# Patient Record
Sex: Male | Born: 1955
Health system: Southern US, Community
[De-identification: ages and names within clinical notes are randomized; demographics above are authoritative.]

## PROBLEM LIST (undated history)

## (undated) HISTORY — PX: DENTAL SURGERY: SHX609

## (undated) HISTORY — PX: CHOLECYSTECTOMY: SHX55

---

## 2013-04-01 LAB — CBC AND DIFFERENTIAL
HCT: 44 % (ref 41–53)
Hemoglobin: 15.3 g/dL (ref 13.5–17.5)
Platelets: 136 10*3/uL — AB (ref 150–399)
WBC: 8.2 10^3/mL

## 2013-04-01 LAB — PSA: PSA: 1.4

## 2013-04-01 LAB — TSH: TSH: 1.17 u[IU]/mL (ref 0.41–5.90)

## 2013-04-01 LAB — LIPID PANEL
Cholesterol: 117 mg/dL (ref 0–200)
HDL: 31 mg/dL — AB (ref 35–70)
LDL Cholesterol: 70 mg/dL
Triglycerides: 80 mg/dL (ref 40–160)

## 2013-12-21 ENCOUNTER — Ambulatory Visit: Payer: Self-pay | Admitting: Family Medicine

## 2014-11-26 IMAGING — CR DG CHEST 2V
1 series · 2 of 2 positions shown · non-contrast
Comparison: None.

CLINICAL DATA: Cough for 10 days.

EXAM:
CHEST  2 VIEW

[Series 1: pa · 0.17mm/px · 2 of 2 slices shown]
[im 1/2]
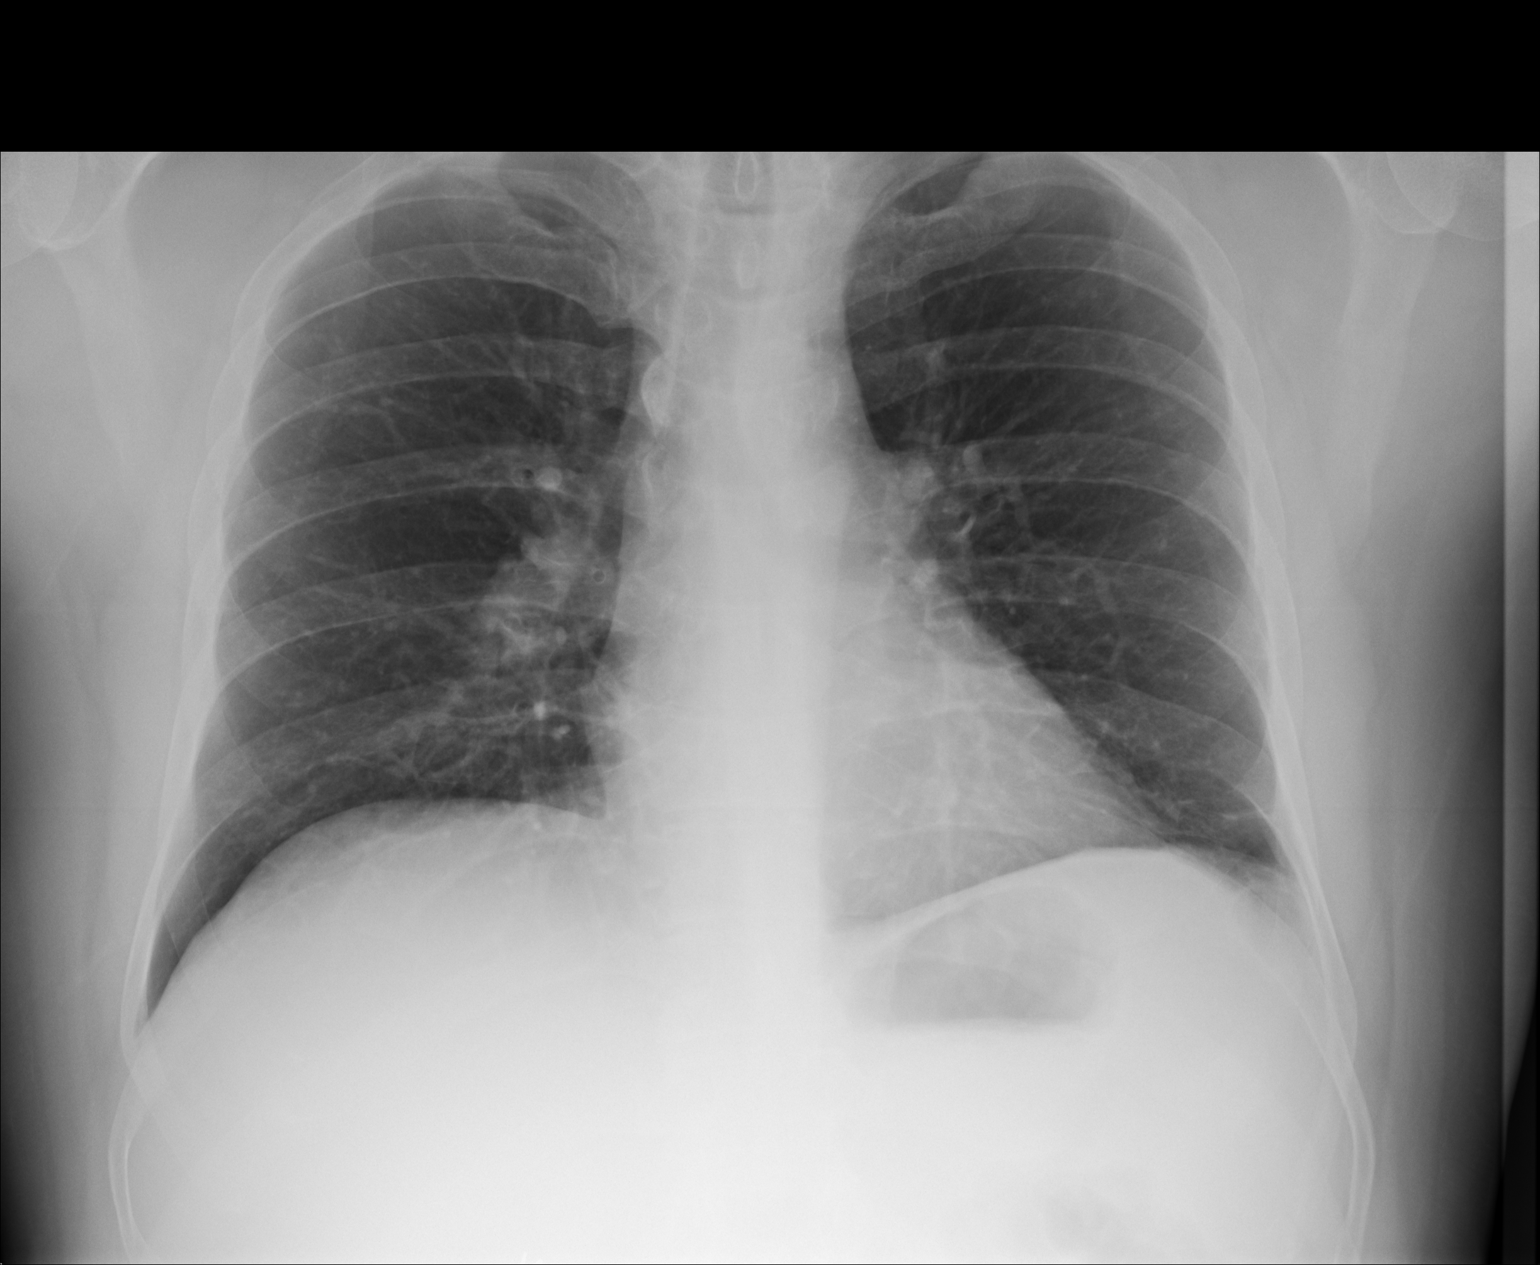
[im 2/2]
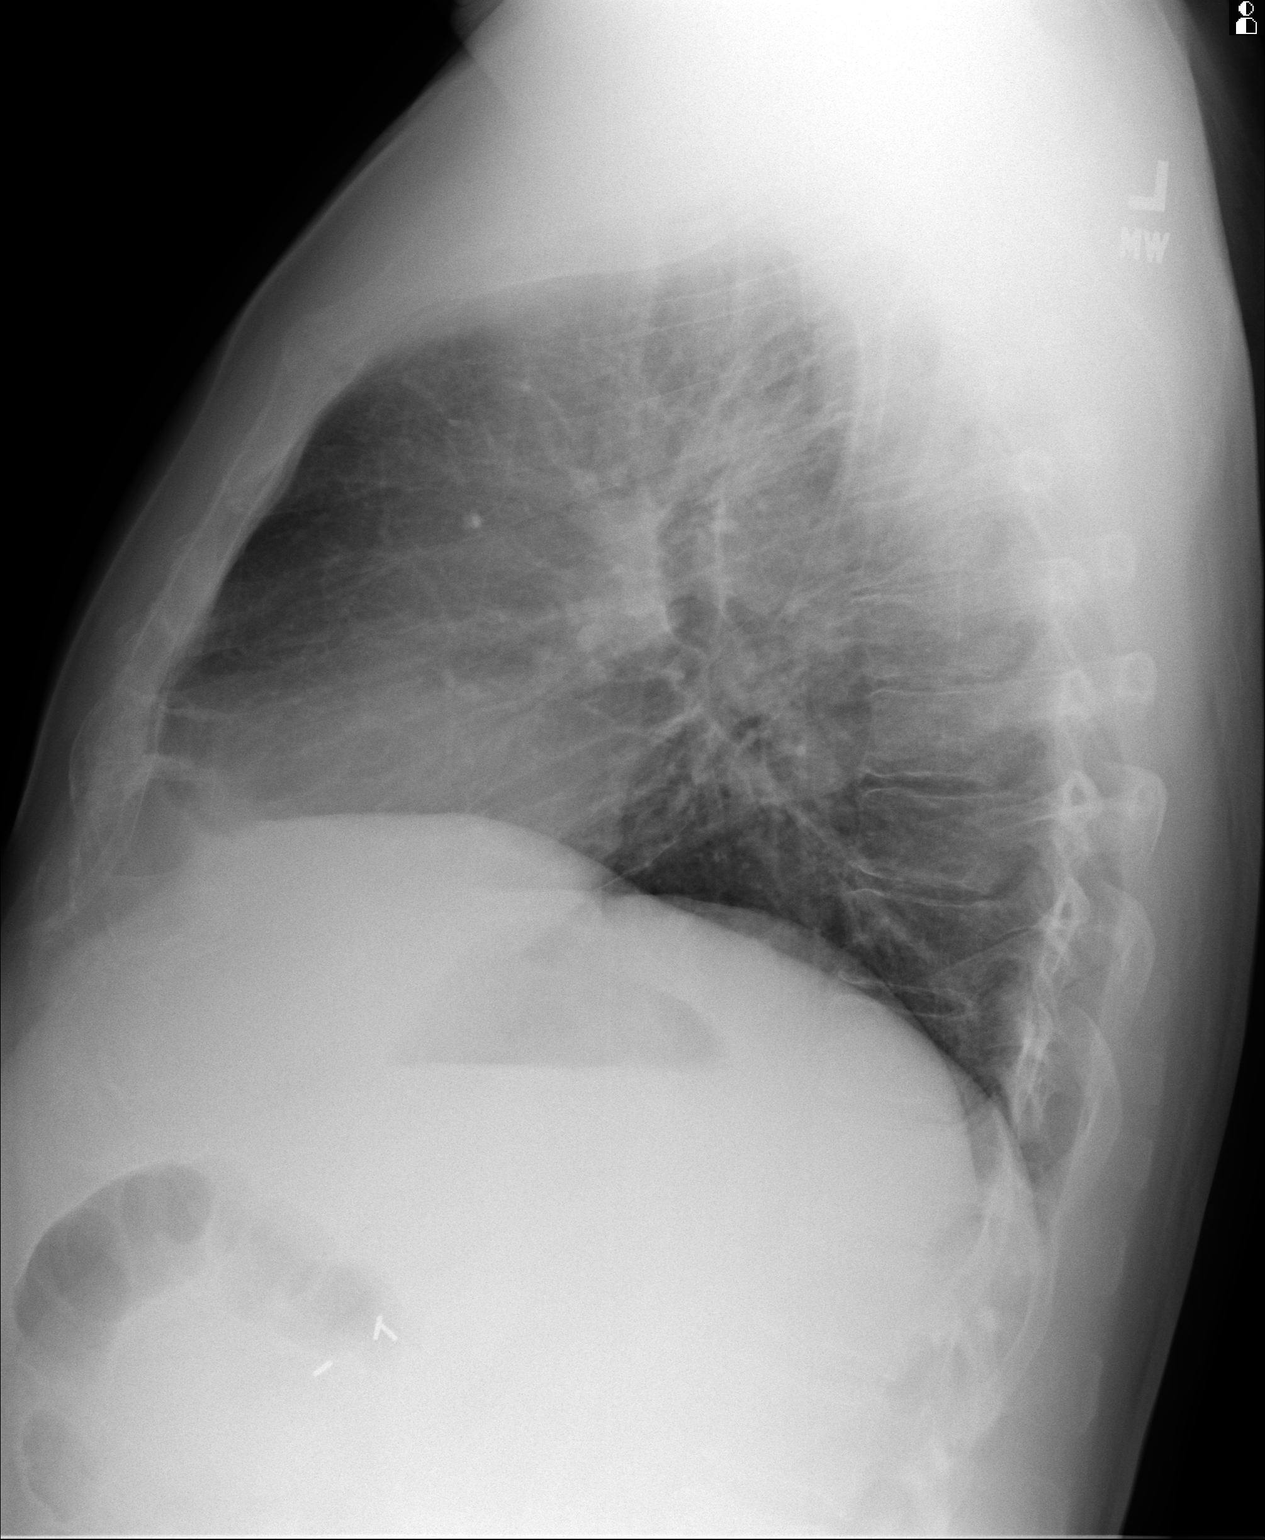

[2 of 2 positions shown; findings below may reference images not displayed]

FINDINGS: The heart size and mediastinal contours are within normal limits.
Both lungs are clear. The visualized skeletal structures are
unremarkable.
IMPRESSION: No active cardiopulmonary disease.

## 2015-03-09 ENCOUNTER — Encounter: Payer: Self-pay | Admitting: Family Medicine

## 2015-03-09 DIAGNOSIS — M5090 Cervical disc disorder, unspecified, unspecified cervical region: Secondary | ICD-10-CM

## 2015-03-09 DIAGNOSIS — E1122 Type 2 diabetes mellitus with diabetic chronic kidney disease: Secondary | ICD-10-CM

## 2015-03-09 DIAGNOSIS — N182 Chronic kidney disease, stage 2 (mild): Secondary | ICD-10-CM

## 2015-03-09 DIAGNOSIS — M5030 Other cervical disc degeneration, unspecified cervical region: Secondary | ICD-10-CM

## 2015-03-16 ENCOUNTER — Other Ambulatory Visit: Payer: Self-pay | Admitting: Family Medicine

## 2015-03-16 DIAGNOSIS — R238 Other skin changes: Secondary | ICD-10-CM

## 2015-03-16 DIAGNOSIS — I10 Essential (primary) hypertension: Secondary | ICD-10-CM

## 2015-03-16 DIAGNOSIS — E119 Type 2 diabetes mellitus without complications: Secondary | ICD-10-CM

## 2015-03-16 DIAGNOSIS — E785 Hyperlipidemia, unspecified: Secondary | ICD-10-CM

## 2015-03-16 DIAGNOSIS — Z125 Encounter for screening for malignant neoplasm of prostate: Secondary | ICD-10-CM

## 2015-03-23 ENCOUNTER — Other Ambulatory Visit: Payer: Self-pay | Admitting: Family Medicine

## 2015-03-23 DIAGNOSIS — M5030 Other cervical disc degeneration, unspecified cervical region: Secondary | ICD-10-CM

## 2015-03-23 DIAGNOSIS — J40 Bronchitis, not specified as acute or chronic: Secondary | ICD-10-CM

## 2015-03-23 MED ORDER — AZITHROMYCIN 250 MG PO TABS: ORAL_TABLET | ORAL | Status: DC

## 2015-03-23 MED ORDER — MELOXICAM 15 MG PO TABS: 15.0000 mg | ORAL_TABLET | Freq: Every day | ORAL | Status: DC

## 2015-04-13 ENCOUNTER — Other Ambulatory Visit: Payer: Self-pay | Admitting: Family Medicine

## 2015-04-13 DIAGNOSIS — M5030 Other cervical disc degeneration, unspecified cervical region: Secondary | ICD-10-CM

## 2015-04-13 MED ORDER — OXYCODONE HCL 5 MG PO TABS: 5.0000 mg | ORAL_TABLET | ORAL | Status: DC | PRN

## 2015-05-11 ENCOUNTER — Other Ambulatory Visit: Payer: Self-pay | Admitting: Family Medicine

## 2015-05-11 DIAGNOSIS — M5030 Other cervical disc degeneration, unspecified cervical region: Secondary | ICD-10-CM

## 2015-05-11 MED ORDER — OXYCODONE HCL 5 MG PO TABS: 5.0000 mg | ORAL_TABLET | ORAL | Status: DC | PRN

## 2015-05-25 ENCOUNTER — Other Ambulatory Visit: Payer: Self-pay | Admitting: Family Medicine

## 2015-05-25 DIAGNOSIS — M5030 Other cervical disc degeneration, unspecified cervical region: Secondary | ICD-10-CM

## 2015-05-25 MED ORDER — OXYCODONE HCL 5 MG PO TABS
5.0000 mg | ORAL_TABLET | ORAL | Status: DC | PRN
Start: 2015-05-25 — End: 2015-06-08

## 2015-06-08 ENCOUNTER — Other Ambulatory Visit: Payer: Self-pay | Admitting: Family Medicine

## 2015-06-08 DIAGNOSIS — M5030 Other cervical disc degeneration, unspecified cervical region: Secondary | ICD-10-CM

## 2015-06-08 MED ORDER — OXYCODONE HCL 5 MG PO TABS: 5.0000 mg | ORAL_TABLET | ORAL | Status: DC | PRN

## 2015-06-21 MED ORDER — OXYCODONE HCL 5 MG PO TABS: 5.0000 mg | ORAL_TABLET | ORAL | Status: DC | PRN

## 2015-07-05 ENCOUNTER — Other Ambulatory Visit: Payer: Self-pay | Admitting: Family Medicine

## 2015-07-05 DIAGNOSIS — F419 Anxiety disorder, unspecified: Secondary | ICD-10-CM

## 2015-07-05 MED ORDER — ALPRAZOLAM 1 MG PO TABS: 1.0000 mg | ORAL_TABLET | Freq: Two times a day (BID) | ORAL | Status: DC | PRN

## 2015-07-06 ENCOUNTER — Other Ambulatory Visit: Payer: Self-pay | Admitting: Family Medicine

## 2015-07-06 DIAGNOSIS — M5030 Other cervical disc degeneration, unspecified cervical region: Secondary | ICD-10-CM

## 2015-07-06 MED ORDER — OXYCODONE HCL 5 MG PO TABS: 5.0000 mg | ORAL_TABLET | ORAL | Status: DC | PRN

## 2015-08-22 ENCOUNTER — Encounter: Payer: Self-pay | Admitting: Family Medicine

## 2015-08-22 DIAGNOSIS — M5030 Other cervical disc degeneration, unspecified cervical region: Secondary | ICD-10-CM

## 2015-08-22 MED ORDER — OXYCODONE HCL 5 MG PO TABS: 5.0000 mg | ORAL_TABLET | ORAL | Status: DC | PRN

## 2015-09-06 ENCOUNTER — Other Ambulatory Visit: Payer: Self-pay | Admitting: Family Medicine

## 2015-09-06 DIAGNOSIS — M5030 Other cervical disc degeneration, unspecified cervical region: Secondary | ICD-10-CM

## 2015-09-06 MED ORDER — OXYCODONE HCL 5 MG PO TABS: 5.0000 mg | ORAL_TABLET | ORAL | Status: DC | PRN

## 2015-09-21 ENCOUNTER — Other Ambulatory Visit: Payer: Self-pay | Admitting: Family Medicine

## 2015-09-21 DIAGNOSIS — M5030 Other cervical disc degeneration, unspecified cervical region: Secondary | ICD-10-CM

## 2015-09-21 MED ORDER — OXYCODONE HCL 5 MG PO TABS: 5.0000 mg | ORAL_TABLET | ORAL | Status: DC | PRN

## 2015-09-28 DIAGNOSIS — M7670 Peroneal tendinitis, unspecified leg: Secondary | ICD-10-CM | POA: Insufficient documentation

## 2015-10-03 DIAGNOSIS — M109 Gout, unspecified: Secondary | ICD-10-CM | POA: Insufficient documentation

## 2015-10-03 DIAGNOSIS — E7849 Other hyperlipidemia: Secondary | ICD-10-CM | POA: Insufficient documentation

## 2015-10-03 DIAGNOSIS — K219 Gastro-esophageal reflux disease without esophagitis: Secondary | ICD-10-CM | POA: Insufficient documentation

## 2015-10-03 DIAGNOSIS — E291 Testicular hypofunction: Secondary | ICD-10-CM | POA: Insufficient documentation

## 2015-10-03 DIAGNOSIS — M1990 Unspecified osteoarthritis, unspecified site: Secondary | ICD-10-CM | POA: Insufficient documentation

## 2015-10-03 DIAGNOSIS — G4700 Insomnia, unspecified: Secondary | ICD-10-CM | POA: Insufficient documentation

## 2015-10-03 DIAGNOSIS — F419 Anxiety disorder, unspecified: Secondary | ICD-10-CM | POA: Insufficient documentation

## 2015-10-05 ENCOUNTER — Other Ambulatory Visit: Payer: Self-pay | Admitting: Family Medicine

## 2015-10-05 DIAGNOSIS — M5030 Other cervical disc degeneration, unspecified cervical region: Secondary | ICD-10-CM

## 2015-10-05 MED ORDER — LOSARTAN POTASSIUM 50 MG PO TABS
50.0000 mg | ORAL_TABLET | Freq: Every day | ORAL | Status: DC
Start: 2015-10-05 — End: 2016-09-12

## 2015-10-05 MED ORDER — OXYCODONE HCL 5 MG PO TABS: 5.0000 mg | ORAL_TABLET | ORAL | Status: DC | PRN

## 2015-10-10 LAB — CBC AND DIFFERENTIAL
HCT: 42 % (ref 41–53)
Hemoglobin: 15.2 g/dL (ref 13.5–17.5)
Neutrophils Absolute: 4 /uL
Platelets: 150 10*3/uL (ref 150–399)
WBC: 6.3 10^3/mL

## 2015-10-10 LAB — HEPATIC FUNCTION PANEL
ALT: 57 U/L — AB (ref 10–40)
AST: 25 U/L (ref 14–40)
Alkaline Phosphatase: 86 U/L (ref 25–125)
Bilirubin, Total: 1.5 mg/dL

## 2015-10-10 LAB — BASIC METABOLIC PANEL
BUN: 11 mg/dL (ref 4–21)
Creatinine: 1.1 mg/dL (ref 0.6–1.3)
Glucose: 250 mg/dL
Potassium: 4 mmol/L (ref 3.4–5.3)
Sodium: 139 mmol/L (ref 137–147)

## 2015-10-10 LAB — LIPID PANEL
Cholesterol: 140 mg/dL (ref 0–200)
HDL: 34 mg/dL — AB (ref 35–70)
LDL Cholesterol: 78 mg/dL
LDl/HDL Ratio: 2.3
Triglycerides: 141 mg/dL (ref 40–160)

## 2015-10-10 LAB — PSA: PSA: 2

## 2015-10-10 LAB — TSH: TSH: 0.74 u[IU]/mL (ref 0.41–5.90)

## 2015-10-18 DIAGNOSIS — E119 Type 2 diabetes mellitus without complications: Secondary | ICD-10-CM | POA: Insufficient documentation

## 2015-10-26 ENCOUNTER — Ambulatory Visit (INDEPENDENT_AMBULATORY_CARE_PROVIDER_SITE_OTHER): Payer: BLUE CROSS/BLUE SHIELD | Admitting: Family Medicine

## 2015-10-26 ENCOUNTER — Encounter: Payer: Self-pay | Admitting: Family Medicine

## 2015-10-26 VITALS — BP 136/62 | HR 88 | Temp 98.0°F | Resp 16 | Ht 70.0 in | Wt 197.0 lb

## 2015-10-26 DIAGNOSIS — Z0000 Encounter for general adult medical examination without abnormal findings: Secondary | ICD-10-CM | POA: Diagnosis not present

## 2015-10-26 DIAGNOSIS — E1122 Type 2 diabetes mellitus with diabetic chronic kidney disease: Secondary | ICD-10-CM | POA: Diagnosis not present

## 2015-10-26 DIAGNOSIS — N182 Chronic kidney disease, stage 2 (mild): Secondary | ICD-10-CM | POA: Diagnosis not present

## 2015-10-26 LAB — POCT UA - MICROALBUMIN: Microalbumin Ur, POC: <20 mg/L

## 2015-10-26 NOTE — Progress Notes (Signed)
Patient: Willie Long, Male    DOB: 06-Jan-1956, 60 y.o.   MRN: [id number] Visit Date: 10/26/2015  Today's Provider: Megan Mans, MD   Chief Complaint  Patient presents with  . Annual Exam   Subjective:    Annual physical exam BERTHA EARWOOD is a 60 y.o. male who presents today for health maintenance and complete physical. He feels poorly due to URI symptoms. He reports exercising 3 days per week. He reports he is sleeping fairly well.  -----------------------------------------------------------------   Review of Systems  Constitutional: Negative.   HENT: Positive for postnasal drip and rhinorrhea.   Eyes: Negative.   Respiratory: Negative.   Gastrointestinal: Positive for diarrhea.  Endocrine: Negative.   Genitourinary: Negative.   Musculoskeletal: Positive for back pain, neck pain and neck stiffness.  Neurological: Negative.   Hematological: Negative.   Psychiatric/Behavioral: Negative.   All other systems reviewed and are negative.   Social History      He  reports that he has never smoked. He has never used smokeless tobacco. He reports that he drinks about 0.6 oz of alcohol per week. He reports that he does not use illicit drugs.       Social History   Social History  . Marital Status: Married    Spouse Name: N/A  . Number of Children: N/A  . Years of Education: N/A   Social History Main Topics  . Smoking status: Never Smoker   . Smokeless tobacco: Never Used  . Alcohol Use: 0.6 oz/week    1 Cans of beer per week  . Drug Use: No  . Sexual Activity: Not Asked   Other Topics Concern  . None   Social History Narrative    No past medical history on file.   Patient Active Problem List   Diagnosis Date Noted  . Type 2 diabetes mellitus (HCC) 10/18/2015  . Anxiety 10/03/2015  . Esophageal reflux 10/03/2015  . Gout 10/03/2015  . Familial multiple lipoprotein-type hyperlipidemia 10/03/2015  . Male hypogonadism 10/03/2015  . Cannot sleep  10/03/2015  . Arthritis, degenerative 10/03/2015  . Bronchitis 03/23/2015  . DDD (degenerative disc disease), cervical 03/09/2015    Past Surgical History  Procedure Laterality Date  . Cholecystectomy      Family History        Family Status  Relation Status Death Age  . Mother Deceased   . Father Deceased   . Sister Alive         His family history includes Arthritis in his father and mother; Endometrial cancer in his mother; Heart disease in his paternal grandmother; Hyperlipidemia in his mother; Liver cancer in his mother; Prostate cancer in his father; Stroke in his father and paternal grandfather.    No Known Allergies  Previous Medications   ALPRAZOLAM (XANAX) 1 MG TABLET    Take 1 tablet (1 mg total) by mouth 2 (two) times daily as needed for anxiety.   ASPIRIN 81 MG TABLET    Take by mouth. Reported on 10/26/2015   DEXAMETHASONE (DECADRON) 4 MG TABLET    Take by mouth. Reported on 10/26/2015   ESOMEPRAZOLE (NEXIUM) 40 MG CAPSULE    Take by mouth.   LOSARTAN (COZAAR) 50 MG TABLET    Take 1 tablet (50 mg total) by mouth daily.   METFORMIN (GLUCOPHAGE-XR) 500 MG 24 HR TABLET    Take 500 mg by mouth daily with breakfast.   OXYCODONE (OXY IR/ROXICODONE) 5 MG IMMEDIATE RELEASE  TABLET    Take 1 tablet (5 mg total) by mouth every 4 (four) hours as needed for severe pain or breakthrough pain.   ROSUVASTATIN (CRESTOR) 10 MG TABLET    Take by mouth.   ZOLPIDEM (AMBIEN CR) 6.25 MG CR TABLET    Take by mouth.    Patient Care Team: Maple Hudson., MD as PCP - General (Family Medicine)     Objective:   Vitals: BP 136/62 mmHg  Pulse 88  Temp(Src) 98 F (36.7 C) (Oral)  Resp 16  Ht  (1.778 m)  Wt 197 lb (89.359 kg)  BMI 28.27 kg/m2  SpO2 98%   Physical Exam  Constitutional: He is oriented to person, place, and time. He appears well-developed and well-nourished.  HENT:  Head: Normocephalic and atraumatic.  Right Ear: External ear normal.  Left Ear: External  ear normal.  Nose: Nose normal.  Eyes: Conjunctivae and EOM are normal. Pupils are equal, round, and reactive to light.  Neck: Neck supple. No thyromegaly present.  Cardiovascular: Normal rate, regular rhythm and normal heart sounds.   Pulmonary/Chest: Effort normal and breath sounds normal.  Abdominal: Soft.  Genitourinary:  DRE are to patient request. He is having diarrhea today that he attributes to the metformin.  Musculoskeletal: He exhibits no edema.  Lymphadenopathy:    He has no cervical adenopathy.  Neurological: He is alert and oriented to person, place, and time.  Diabetic foot exam normal.neurologic exam grossly nonfocal.  Skin: Skin is warm and dry.  Psychiatric: He has a normal mood and affect. His behavior is normal. Judgment and thought content normal.     Depression Screen PHQ 2/9 Scores 10/26/2015  PHQ - 2 Score 0      Assessment & Plan:     Routine Health Maintenance and Physical Exam  Exercise Activities and Dietary recommendations Goals    None      Immunization History  Administered Date(s) Administered  . Hepatitis B 05/30/2011, 06/27/2011, 11/28/2011  . Influenza,inj,Quad PF,36+ Mos 07/01/2015  . Tdap 05/16/2011    Health Maintenance  Topic Date Due  . HEMOGLOBIN A1C  06/14/1956  . Hepatitis C Screening  [birth year]  . PNEUMOCOCCAL POLYSACCHARIDE VACCINE (1) 06/07/1959  . FOOT EXAM  06/07/1967  . OPHTHALMOLOGY EXAM  06/07/1967  . HIV Screening  06/06/1972  . COLONOSCOPY  06/07/2007  . INFLUENZA VACCINE  12/29/2015 (Originally 05/01/2015)  . TETANUS/TDAP  05/15/2021      Discussed health benefits of physical activity, and encouraged him to engage in regular exercise appropriate for his age and condition.   Recent onset type 2 diabetes He is doing well with diet and exercise and his sugars of Morrie Sheldon come down since starting the metformin. Mild GI side effects so we'll try metformin XR daily.A1c in 3 months. Chronic  degenerative disc disease--cervical more symptomatically been lumbar Pain has been less recently with lifestyle interventions.try to continue to cut down on oxycodone 5 mg he is actually doing well with this. Follow-up in one month for this. HTN Controlled on ARB. I have done the exam and reviewed the above chart and it is accurate to the best of my knowledge.  --------------------------------------------------------------------

## 2015-11-23 ENCOUNTER — Other Ambulatory Visit: Payer: Self-pay | Admitting: Family Medicine

## 2015-11-23 DIAGNOSIS — M5030 Other cervical disc degeneration, unspecified cervical region: Secondary | ICD-10-CM

## 2015-11-23 MED ORDER — OXYCODONE HCL 5 MG PO TABS: 5.0000 mg | ORAL_TABLET | ORAL | Status: DC | PRN

## 2015-12-01 ENCOUNTER — Encounter: Payer: Self-pay | Admitting: Family Medicine

## 2015-12-12 ENCOUNTER — Other Ambulatory Visit: Payer: Self-pay | Admitting: Family Medicine

## 2015-12-12 DIAGNOSIS — M5030 Other cervical disc degeneration, unspecified cervical region: Secondary | ICD-10-CM

## 2015-12-12 MED ORDER — OXYCODONE HCL 5 MG PO TABS: 5.0000 mg | ORAL_TABLET | ORAL | Status: DC | PRN

## 2015-12-27 ENCOUNTER — Other Ambulatory Visit: Payer: Self-pay | Admitting: Family Medicine

## 2016-01-25 ENCOUNTER — Ambulatory Visit (INDEPENDENT_AMBULATORY_CARE_PROVIDER_SITE_OTHER): Payer: BLUE CROSS/BLUE SHIELD | Admitting: Family Medicine

## 2016-01-25 VITALS — BP 122/60 | HR 80 | Temp 98.6°F | Resp 14

## 2016-01-25 DIAGNOSIS — N182 Chronic kidney disease, stage 2 (mild): Secondary | ICD-10-CM

## 2016-01-25 DIAGNOSIS — E1122 Type 2 diabetes mellitus with diabetic chronic kidney disease: Secondary | ICD-10-CM | POA: Diagnosis not present

## 2016-01-25 LAB — POCT GLYCOSYLATED HEMOGLOBIN (HGB A1C): Hemoglobin A1C: 5.5

## 2016-01-25 NOTE — Progress Notes (Signed)
Patient ID: Willie Long, male   DOB: October 08, 1955, 60 y.o.   MRN: [id number]    Subjective:  HPI  Patient is here for follow up on diabetes. Patient is still taking Metformin XR 500 mg daily. He is still having some diarrhea but is not sure if its the medication or not having gallbladder. He is checking sugar and readings have been around 110-*130. He did have a reading of 89 but no hypoglycemic episodes with that.   Prior to Admission medications   Medication Sig Start Date End Date Taking? Authorizing Provider  ALPRAZolam Duanne Moron) 1 MG tablet Take 1 tablet (1 mg total) by mouth 2 (two) times daily as needed for anxiety. 07/05/15  Yes Jerrol Banana., MD  aspirin 81 MG tablet Take by mouth. Reported on 10/26/2015   Yes Historical Provider, MD  dexamethasone (DECADRON) 4 MG tablet Take by mouth. Reported on 10/26/2015 11/10/14  Yes Historical Provider, MD  esomeprazole (NEXIUM) 40 MG capsule Take by mouth.   Yes Historical Provider, MD  losartan (COZAAR) 50 MG tablet Take 1 tablet (50 mg total) by mouth daily. 10/05/15  Yes Richard Maceo Pro., MD  metFORMIN (GLUCOPHAGE-XR) 500 MG 24 hr tablet Take 500 mg by mouth daily with breakfast.   Yes Historical Provider, MD  oxyCODONE (OXY IR/ROXICODONE) 5 MG immediate release tablet Take 1 tablet (5 mg total) by mouth every 4 (four) hours as needed for severe pain or breakthrough pain. 12/12/15  Yes Richard Maceo Pro., MD  rosuvastatin (CRESTOR) 10 MG tablet Take by mouth.   Yes Historical Provider, MD  zolpidem (AMBIEN CR) 6.25 MG CR tablet Take by mouth. 10/20/14  Yes Historical Provider, MD    Patient Active Problem List   Diagnosis Date Noted  . Type 2 diabetes mellitus ([place]) 10/18/2015  . Anxiety 10/03/2015  . Esophageal reflux 10/03/2015  . Gout 10/03/2015  . Familial multiple lipoprotein-type hyperlipidemia 10/03/2015  . Male hypogonadism 10/03/2015  . Cannot sleep 10/03/2015  . Arthritis, degenerative 10/03/2015  . Bronchitis  03/23/2015  . DDD (degenerative disc disease), cervical 03/09/2015    No past medical history on file.  Social History   Social History  . Marital Status: Married    Spouse Name: N/A  . Number of Children: N/A  . Years of Education: N/A   Occupational History  . Not on file.   Social History Main Topics  . Smoking status: Never Smoker   . Smokeless tobacco: Never Used  . Alcohol Use: 0.6 oz/week    1 Cans of beer per week  . Drug Use: No  . Sexual Activity: Not on file   Other Topics Concern  . Not on file   Social History Narrative    No Known Allergies  Review of Systems  Constitutional: Negative.   Respiratory: Negative.   Cardiovascular: Negative.   Gastrointestinal: Positive for diarrhea.  Musculoskeletal: Negative.     Immunization History  Administered Date(s) Administered  . Hepatitis B 05/30/2011, 06/27/2011, 11/28/2011  . Influenza,inj,Quad PF,36+ Mos 07/01/2015  . Tdap 05/16/2011   Objective:  BP 122/60 mmHg  Pulse 80  Temp(Src) 98.6 F (37 C)  Resp 14  Physical Exam  Lab Results  Component Value Date   WBC 8.2 04/01/2013   HGB 15.3 04/01/2013   HCT 44 04/01/2013   PLT 136* 04/01/2013   CHOL 117 04/01/2013   TRIG 80 04/01/2013   HDL 31* 04/01/2013   LDLCALC 70 04/01/2013   TSH 1.17 04/01/2013  PSA 1.4 04/01/2013    CMP    Assessment and Plan :  1. Type 2 diabetes mellitus with stage 2 chronic kidney disease, without long-term current use of insulin (HCC) A1C was 9.5 and now its 5.5. Better. - POCT HgB A1C 2. Hypertension Much improved. Follow-up routine labs in the next few months to include met C and lipids 3. Cervical disc disease Presently stable. Patient is attempting to cut back on medications.  Patient was seen and examined by Dr. Eulas Post and note was scribed by Theressa Millard, RMA.   Miguel Aschoff MD [facility] 01/25/2016 2:17 PM

## 2016-02-07 ENCOUNTER — Other Ambulatory Visit: Payer: Self-pay | Admitting: Family Medicine

## 2016-02-07 DIAGNOSIS — M5030 Other cervical disc degeneration, unspecified cervical region: Secondary | ICD-10-CM

## 2016-02-07 MED ORDER — OXYCODONE HCL 5 MG PO TABS: 5.0000 mg | ORAL_TABLET | ORAL | Status: DC | PRN

## 2016-02-07 NOTE — Telephone Encounter (Signed)
Pain/discomfort and neck persists. Patient working to cut back on medications only when absolutely necessary.

## 2016-02-09 ENCOUNTER — Other Ambulatory Visit: Payer: Self-pay | Admitting: Family Medicine

## 2016-02-09 DIAGNOSIS — F419 Anxiety disorder, unspecified: Secondary | ICD-10-CM

## 2016-02-09 MED ORDER — ALPRAZOLAM 1 MG PO TABS: 1.0000 mg | ORAL_TABLET | Freq: Two times a day (BID) | ORAL | Status: DC | PRN

## 2016-02-09 NOTE — Telephone Encounter (Signed)
Patient going to [place] in June and needs refill of Xanax both for the plane ride and for insomnia. Overall doing pretty well. Some stress regarding work issues and career.

## 2016-03-07 ENCOUNTER — Other Ambulatory Visit: Payer: Self-pay | Admitting: Family Medicine

## 2016-03-07 DIAGNOSIS — M5030 Other cervical disc degeneration, unspecified cervical region: Secondary | ICD-10-CM

## 2016-03-07 MED ORDER — OXYCODONE HCL 5 MG PO TABS: 5.0000 mg | ORAL_TABLET | ORAL | Status: DC | PRN

## 2016-03-12 ENCOUNTER — Other Ambulatory Visit: Payer: Self-pay | Admitting: Family Medicine

## 2016-03-12 DIAGNOSIS — Z7184 Encounter for health counseling related to travel: Secondary | ICD-10-CM

## 2016-03-12 MED ORDER — AZITHROMYCIN 250 MG PO TABS: ORAL_TABLET | ORAL | Status: DC

## 2016-04-11 ENCOUNTER — Other Ambulatory Visit: Payer: Self-pay | Admitting: Family Medicine

## 2016-04-11 DIAGNOSIS — M5030 Other cervical disc degeneration, unspecified cervical region: Secondary | ICD-10-CM

## 2016-04-11 MED ORDER — OXYCODONE HCL 5 MG PO TABS: 5.0000 mg | ORAL_TABLET | ORAL | Status: DC | PRN

## 2016-05-07 ENCOUNTER — Other Ambulatory Visit: Payer: Self-pay | Admitting: Family Medicine

## 2016-05-07 DIAGNOSIS — M5030 Other cervical disc degeneration, unspecified cervical region: Secondary | ICD-10-CM

## 2016-05-07 MED ORDER — OXYCODONE HCL 5 MG PO TABS: 5.0000 mg | ORAL_TABLET | ORAL | 0 refills | Status: DC | PRN

## 2016-05-22 ENCOUNTER — Other Ambulatory Visit: Payer: Self-pay | Admitting: Family Medicine

## 2016-05-22 DIAGNOSIS — M5030 Other cervical disc degeneration, unspecified cervical region: Secondary | ICD-10-CM

## 2016-05-22 MED ORDER — OXYCODONE HCL 5 MG PO TABS: 5.0000 mg | ORAL_TABLET | ORAL | 0 refills | Status: DC | PRN

## 2016-06-04 ENCOUNTER — Other Ambulatory Visit: Payer: Self-pay | Admitting: Family Medicine

## 2016-06-04 DIAGNOSIS — M5030 Other cervical disc degeneration, unspecified cervical region: Secondary | ICD-10-CM

## 2016-06-04 MED ORDER — OXYCODONE HCL 5 MG PO TABS: 5.0000 mg | ORAL_TABLET | ORAL | 0 refills | Status: DC | PRN

## 2016-07-02 ENCOUNTER — Other Ambulatory Visit: Payer: Self-pay | Admitting: Family Medicine

## 2016-07-02 DIAGNOSIS — M5030 Other cervical disc degeneration, unspecified cervical region: Secondary | ICD-10-CM

## 2016-07-02 MED ORDER — OXYCODONE HCL 5 MG PO TABS: 5.0000 mg | ORAL_TABLET | ORAL | 0 refills | Status: DC | PRN

## 2016-07-17 ENCOUNTER — Other Ambulatory Visit: Payer: Self-pay | Admitting: Family Medicine

## 2016-07-17 DIAGNOSIS — M5030 Other cervical disc degeneration, unspecified cervical region: Secondary | ICD-10-CM

## 2016-07-17 MED ORDER — OXYCODONE HCL 5 MG PO TABS: 5.0000 mg | ORAL_TABLET | ORAL | 0 refills | Status: DC | PRN

## 2016-07-25 ENCOUNTER — Other Ambulatory Visit: Payer: Self-pay | Admitting: Family Medicine

## 2016-07-25 MED ORDER — ZOLPIDEM TARTRATE ER 6.25 MG PO TBCR: 6.2500 mg | EXTENDED_RELEASE_TABLET | Freq: Every evening | ORAL | 5 refills | Status: DC | PRN

## 2016-07-29 ENCOUNTER — Other Ambulatory Visit: Payer: Self-pay | Admitting: Family Medicine

## 2016-07-29 DIAGNOSIS — M5030 Other cervical disc degeneration, unspecified cervical region: Secondary | ICD-10-CM

## 2016-07-29 MED ORDER — OXYCODONE HCL 5 MG PO TABS: 5.0000 mg | ORAL_TABLET | ORAL | 0 refills | Status: DC | PRN

## 2016-08-13 ENCOUNTER — Other Ambulatory Visit: Payer: Self-pay | Admitting: Family Medicine

## 2016-08-13 DIAGNOSIS — M5030 Other cervical disc degeneration, unspecified cervical region: Secondary | ICD-10-CM

## 2016-08-13 MED ORDER — OXYCODONE HCL 5 MG PO TABS: 5.0000 mg | ORAL_TABLET | ORAL | 0 refills | Status: DC | PRN

## 2016-08-13 NOTE — Progress Notes (Signed)
Pain stable in neck.

## 2016-08-27 ENCOUNTER — Other Ambulatory Visit: Payer: Self-pay | Admitting: Family Medicine

## 2016-08-27 DIAGNOSIS — M5030 Other cervical disc degeneration, unspecified cervical region: Secondary | ICD-10-CM

## 2016-08-27 MED ORDER — OXYCODONE HCL 5 MG PO TABS: 5.0000 mg | ORAL_TABLET | ORAL | 0 refills | Status: DC | PRN

## 2016-08-27 NOTE — Progress Notes (Signed)
Stable--trying to cut back some.

## 2016-08-29 ENCOUNTER — Other Ambulatory Visit: Payer: Self-pay | Admitting: Family Medicine

## 2016-08-29 DIAGNOSIS — F419 Anxiety disorder, unspecified: Secondary | ICD-10-CM

## 2016-08-29 MED ORDER — ALPRAZOLAM 1 MG PO TABS: 1.0000 mg | ORAL_TABLET | Freq: Two times a day (BID) | ORAL | 5 refills | Status: DC | PRN

## 2016-09-09 ENCOUNTER — Other Ambulatory Visit: Payer: Self-pay | Admitting: Family Medicine

## 2016-09-09 DIAGNOSIS — M5030 Other cervical disc degeneration, unspecified cervical region: Secondary | ICD-10-CM

## 2016-09-09 MED ORDER — OXYCODONE HCL 5 MG PO TABS: 5.0000 mg | ORAL_TABLET | ORAL | 0 refills | Status: DC | PRN

## 2016-09-09 NOTE — Progress Notes (Signed)
DDD stable.

## 2016-09-12 ENCOUNTER — Other Ambulatory Visit: Payer: Self-pay | Admitting: Family Medicine

## 2016-09-12 DIAGNOSIS — I10 Essential (primary) hypertension: Secondary | ICD-10-CM

## 2016-10-10 ENCOUNTER — Other Ambulatory Visit: Payer: Self-pay | Admitting: Family Medicine

## 2016-10-10 DIAGNOSIS — M5030 Other cervical disc degeneration, unspecified cervical region: Secondary | ICD-10-CM

## 2016-10-10 MED ORDER — OXYCODONE HCL 5 MG PO TABS: 5.0000 mg | ORAL_TABLET | ORAL | 0 refills | Status: DC | PRN

## 2016-10-24 ENCOUNTER — Other Ambulatory Visit: Payer: Self-pay | Admitting: Family Medicine

## 2016-10-24 DIAGNOSIS — M5030 Other cervical disc degeneration, unspecified cervical region: Secondary | ICD-10-CM

## 2016-10-24 MED ORDER — OXYCODONE HCL 5 MG PO TABS: 5.0000 mg | ORAL_TABLET | ORAL | 0 refills | Status: DC | PRN

## 2016-12-19 ENCOUNTER — Other Ambulatory Visit: Payer: Self-pay | Admitting: Family Medicine

## 2017-01-09 ENCOUNTER — Other Ambulatory Visit: Payer: Self-pay | Admitting: Emergency Medicine

## 2017-01-09 ENCOUNTER — Other Ambulatory Visit: Payer: Self-pay | Admitting: Family Medicine

## 2017-01-09 DIAGNOSIS — N182 Chronic kidney disease, stage 2 (mild): Secondary | ICD-10-CM

## 2017-01-09 DIAGNOSIS — E1122 Type 2 diabetes mellitus with diabetic chronic kidney disease: Secondary | ICD-10-CM

## 2017-01-09 MED ORDER — METFORMIN HCL 500 MG PO TABS: 500.0000 mg | ORAL_TABLET | Freq: Two times a day (BID) | ORAL | 3 refills | Status: DC

## 2017-01-09 NOTE — Progress Notes (Signed)
Per Dr. Sullivan Lone sent in refill for pt.

## 2017-03-27 ENCOUNTER — Other Ambulatory Visit: Payer: Self-pay | Admitting: Family Medicine

## 2017-03-27 DIAGNOSIS — F419 Anxiety disorder, unspecified: Secondary | ICD-10-CM

## 2017-03-27 MED ORDER — ALPRAZOLAM 1 MG PO TABS: 1.0000 mg | ORAL_TABLET | Freq: Two times a day (BID) | ORAL | 5 refills | Status: DC | PRN

## 2017-06-05 ENCOUNTER — Encounter: Payer: Self-pay | Admitting: Family Medicine

## 2017-06-05 ENCOUNTER — Ambulatory Visit (INDEPENDENT_AMBULATORY_CARE_PROVIDER_SITE_OTHER): Payer: BLUE CROSS/BLUE SHIELD | Admitting: Family Medicine

## 2017-06-05 VITALS — BP 142/66 | HR 92 | Temp 97.4°F | Resp 16 | Wt 215.0 lb

## 2017-06-05 DIAGNOSIS — Z0000 Encounter for general adult medical examination without abnormal findings: Secondary | ICD-10-CM

## 2017-06-05 DIAGNOSIS — I10 Essential (primary) hypertension: Secondary | ICD-10-CM | POA: Diagnosis not present

## 2017-06-05 MED ORDER — LOSARTAN POTASSIUM 100 MG PO TABS: 100.0000 mg | ORAL_TABLET | Freq: Every day | ORAL | 3 refills | Status: DC

## 2017-06-05 NOTE — Progress Notes (Addendum)
Patient: Willie Long, Male    DOB: 09/14/1956, 61 y.o.   MRN: [id number] Visit Date: 06/05/2017  Today's Provider: Megan Mans, MD   Chief Complaint  Patient presents with  . Annual Exam   Subjective:    Annual physical exam Willie Long is a 61 y.o. male who presents today for health maintenance and complete physical. He feels well. He reports exercising regularly. He reports he is sleeping well. Fasting blood sugars have been averaging about 130. Overall he feels well. He feels a lot of stress due to work.  -----------------------------------------------------------------   Review of Systems  Constitutional: Negative.   HENT: Positive for postnasal drip.   Eyes: Negative.   Respiratory: Positive for cough.   Cardiovascular: Negative.   Gastrointestinal: Negative.   Endocrine: Negative.   Genitourinary: Negative.   Musculoskeletal: Positive for arthralgias.  Allergic/Immunologic: Positive for environmental allergies.  Neurological: Negative.   Hematological: Negative.   Psychiatric/Behavioral: Negative.     Social History      He  reports that he has never smoked. He has never used smokeless tobacco. He reports that he drinks about 0.6 oz of alcohol per week . He reports that he does not use drugs.       Social History   Social History  . Marital status: Married    Spouse name: N/A  . Number of children: N/A  . Years of education: N/A   Social History Main Topics  . Smoking status: Never Smoker  . Smokeless tobacco: Never Used  . Alcohol use 0.6 oz/week    1 Cans of beer per week  . Drug use: No  . Sexual activity: Not Asked   Other Topics Concern  . None   Social History Narrative  . None    No past medical history on file.   Patient Active Problem List   Diagnosis Date Noted  . Type 2 diabetes mellitus (HCC) 10/18/2015  . Anxiety 10/03/2015  . Esophageal reflux 10/03/2015  . Gout 10/03/2015  . Familial multiple lipoprotein-type  hyperlipidemia 10/03/2015  . Male hypogonadism 10/03/2015  . Cannot sleep 10/03/2015  . Arthritis, degenerative 10/03/2015  . Bronchitis 03/23/2015  . DDD (degenerative disc disease), cervical 03/09/2015    Past Surgical History:  Procedure Laterality Date  . CHOLECYSTECTOMY    . DENTAL SURGERY      Family History        Family Status  Relation Status  . Mother Deceased  . Father Deceased  . Sister Alive  . PGM (Not Specified)  . PGF (Not Specified)        His family history includes Arthritis in his father and mother; Endometrial cancer in his mother; Healthy in his sister; Heart disease in his paternal grandmother; Hyperlipidemia in his mother; Liver cancer in his mother; Prostate cancer in his father; Stroke in his father and paternal grandfather.     No Known Allergies   Current Outpatient Prescriptions:  .  ALPRAZolam (XANAX) 1 MG tablet, Take 1 tablet (1 mg total) by mouth 2 (two) times daily as needed for anxiety., Disp: 60 tablet, Rfl: 5 .  aspirin 81 MG tablet, Take by mouth. Reported on 10/26/2015, Disp: , Rfl:  .  empagliflozin (JARDIANCE) 25 MG TABS tablet, Take 25 mg by mouth daily., Disp: , Rfl:  .  losartan (COZAAR) 50 MG tablet, TAKE 1 TABLET BY MOUTH DAILY, Disp: 90 tablet, Rfl: 3 .  metFORMIN (GLUCOPHAGE) 500 MG tablet,  Take 1 tablet (500 mg total) by mouth 2 (two) times daily., Disp: 180 tablet, Rfl: 3 .  rosuvastatin (CRESTOR) 10 MG tablet, Take by mouth., Disp: , Rfl:  .  zolpidem (AMBIEN CR) 6.25 MG CR tablet, Take 1 tablet (6.25 mg total) by mouth at bedtime as needed for sleep., Disp: 30 tablet, Rfl: 5 .  azithromycin (ZITHROMAX) 250 MG tablet, UAD (Patient not taking: Reported on 06/05/2017), Disp: 6 tablet, Rfl: 1 .  dexamethasone (DECADRON) 4 MG tablet, Take by mouth. Reported on 03/07/2016, Disp: , Rfl:  .  esomeprazole (NEXIUM) 40 MG capsule, Take by mouth., Disp: , Rfl:    Patient Care Team: Maple Hudson., MD as PCP - General (Family  Medicine)      Objective:   Vitals: BP (!) 142/66 (BP Location: Right Arm, Patient Position: Sitting, Cuff Size: Large)   Pulse 92   Temp (!) 97.4 F (36.3 C) (Oral)   Resp 16   Wt 215 lb (97.5 kg)   BMI 30.85 kg/m    Vitals:   06/05/17 1431  BP: (!) 142/66  Pulse: 92  Resp: 16  Temp: (!) 97.4 F (36.3 C)  TempSrc: Oral  Weight: 215 lb (97.5 kg)     Physical Exam  Constitutional: He is oriented to person, place, and time. He appears well-developed and well-nourished.  HENT:  Head: Normocephalic and atraumatic.  Right Ear: External ear normal.  Left Ear: External ear normal.  Nose: Nose normal.  Mouth/Throat: Oropharynx is clear and moist.  Eyes: Pupils are equal, round, and reactive to light. Conjunctivae and EOM are normal. No scleral icterus.  Neck: No thyromegaly present.  Cardiovascular: Normal rate, regular rhythm, normal heart sounds and intact distal pulses.   Pulmonary/Chest: Effort normal and breath sounds normal.  Abdominal: Soft.  Genitourinary: Rectum normal, prostate normal and penis normal.  Musculoskeletal: Normal range of motion.  Lymphadenopathy:    He has no cervical adenopathy.  Neurological: He is alert and oriented to person, place, and time.  Skin: Skin is warm and dry.  Small, extra  left nipple  Psychiatric: He has a normal mood and affect. His behavior is normal. Judgment and thought content normal.     Depression Screen PHQ 2/9 Scores 10/26/2015  PHQ - 2 Score 0      Assessment & Plan:     Routine Health Maintenance and Physical Exam  Exercise Activities and Dietary recommendations Goals    None      Immunization History  Administered Date(s) Administered  . Hepatitis B 05/30/2011, 06/27/2011, 11/28/2011  . Influenza,inj,Quad PF,6+ Mos 07/01/2015  . Tdap 05/16/2011    Health Maintenance  Topic Date Due  . Hepatitis C Screening  [birth year]  . PNEUMOCOCCAL POLYSACCHARIDE VACCINE (1) 06/07/1959  . FOOT EXAM   06/07/1967  . OPHTHALMOLOGY EXAM  06/07/1967  . HIV Screening  06/06/1972  . COLONOSCOPY  06/07/2007  . HEMOGLOBIN A1C  07/26/2016  . INFLUENZA VACCINE  04/30/2017  . TETANUS/TDAP  05/15/2021    Check labs. Discussed health benefits of physical activity, and encouraged him to engage in regular exercise appropriate for his age and condition.  TIIDM--A1C is 5.0 today No change in meds for now.  HTN--Increase Losartan to  daily. HLD    --------------------------------------------------------------------   I have done the exam and reviewed the above chart and it is accurate to the best of my knowledge. Dentist has been used in this note in any air is in the dictation  or transcription are unintentional.  Megan Mansichard Briggs Edelen Jr, MD  [facility]

## 2017-06-19 DIAGNOSIS — Z0000 Encounter for general adult medical examination without abnormal findings: Secondary | ICD-10-CM | POA: Diagnosis not present

## 2017-06-19 LAB — LIPID PANEL
Cholesterol: 123 mg/dL (ref <200)
HDL: 28 mg/dL — ABNORMAL LOW (ref >40)
LDL Cholesterol (Calc): 72 mg/dL (calc)
Non-HDL Cholesterol (Calc): 95 mg/dL (calc) (ref <130)
Total CHOL/HDL Ratio: 4.4 (calc) (ref <5.0)
Triglycerides: 144 mg/dL (ref <150)

## 2017-06-19 LAB — CBC WITH DIFFERENTIAL/PLATELET
Basophils Absolute: 40 cells/uL (ref 0–200)
Basophils Relative: 0.6 %
Eosinophils Absolute: 161 cells/uL (ref 15–500)
Eosinophils Relative: 2.4 %
HCT: 48.1 % (ref 38.5–50.0)
Hemoglobin: 16.9 g/dL (ref 13.2–17.1)
Lymphs Abs: 1173 cells/uL (ref 850–3900)
MCH: 34.2 pg — ABNORMAL HIGH (ref 27.0–33.0)
MCHC: 35.1 g/dL (ref 32.0–36.0)
MCV: 97.4 fL (ref 80.0–100.0)
MPV: 10.8 fL (ref 7.5–12.5)
Monocytes Relative: 7.8 %
Neutro Abs: 4804 cells/uL (ref 1500–7800)
Neutrophils Relative %: 71.7 %
Platelets: 112 10*3/uL — ABNORMAL LOW (ref 140–400)
RBC: 4.94 10*6/uL (ref 4.20–5.80)
RDW: 13.6 % (ref 11.0–15.0)
Total Lymphocyte: 17.5 %
WBC mixed population: 523 cells/uL (ref 200–950)
WBC: 6.7 10*3/uL (ref 3.8–10.8)

## 2017-06-19 LAB — COMPLETE METABOLIC PANEL WITH GFR
AG Ratio: 2.4 (calc) (ref 1.0–2.5)
ALT: 76 U/L — ABNORMAL HIGH (ref 9–46)
AST: 35 U/L (ref 10–35)
Albumin: 5.3 g/dL — ABNORMAL HIGH (ref 3.6–5.1)
Alkaline phosphatase (APISO): 68 U/L (ref 40–115)
BUN: 13 mg/dL (ref 7–25)
CO2: 25 mmol/L (ref 20–32)
Calcium: 9.7 mg/dL (ref 8.6–10.3)
Chloride: 104 mmol/L (ref 98–110)
Creat: 1.08 mg/dL (ref 0.70–1.25)
GFR, Est African American: 86 mL/min/{1.73_m2} (ref >=60)
GFR, Est Non African American: 74 mL/min/{1.73_m2} (ref >=60)
Globulin: 2.2 g/dL (calc) (ref 1.9–3.7)
Glucose, Bld: 98 mg/dL (ref 65–139)
Potassium: 4.2 mmol/L (ref 3.5–5.3)
Sodium: 139 mmol/L (ref 135–146)
Total Bilirubin: 2 mg/dL — ABNORMAL HIGH (ref 0.2–1.2)
Total Protein: 7.5 g/dL (ref 6.1–8.1)

## 2017-06-19 LAB — SPECIMEN ID NOTIFICATION MISSING 2ND ID

## 2017-06-19 LAB — TSH: TSH: 0.69 mIU/L (ref 0.40–4.50)

## 2017-06-19 LAB — PSA: PSA: 2.5 ng/mL (ref <=4.0)

## 2017-07-17 DIAGNOSIS — H53001 Unspecified amblyopia, right eye: Secondary | ICD-10-CM | POA: Diagnosis not present

## 2017-07-17 LAB — HM DIABETES EYE EXAM

## 2017-08-28 ENCOUNTER — Other Ambulatory Visit: Payer: Self-pay | Admitting: Family Medicine

## 2017-11-26 ENCOUNTER — Other Ambulatory Visit: Payer: Self-pay | Admitting: Family Medicine

## 2017-11-26 DIAGNOSIS — F419 Anxiety disorder, unspecified: Secondary | ICD-10-CM

## 2017-11-26 MED ORDER — ALPRAZOLAM 1 MG PO TABS: 1.0000 mg | ORAL_TABLET | Freq: Two times a day (BID) | ORAL | 5 refills | Status: DC | PRN

## 2018-01-15 ENCOUNTER — Other Ambulatory Visit: Payer: Self-pay | Admitting: Family Medicine

## 2018-01-15 DIAGNOSIS — N182 Chronic kidney disease, stage 2 (mild): Secondary | ICD-10-CM

## 2018-01-15 DIAGNOSIS — E1122 Type 2 diabetes mellitus with diabetic chronic kidney disease: Secondary | ICD-10-CM

## 2018-01-15 MED ORDER — METFORMIN HCL 500 MG PO TABS: 500.0000 mg | ORAL_TABLET | Freq: Two times a day (BID) | ORAL | 3 refills | Status: DC

## 2018-01-15 NOTE — Telephone Encounter (Signed)
[facility] is requesting a refill on the following medication  metFORMIN (GLUCOPHAGE) 500 MG tablet  They would like this sent to [facility].

## 2018-03-26 ENCOUNTER — Other Ambulatory Visit: Payer: Self-pay | Admitting: Family Medicine

## 2018-04-22 DIAGNOSIS — Z23 Encounter for immunization: Secondary | ICD-10-CM | POA: Diagnosis not present

## 2018-05-11 ENCOUNTER — Other Ambulatory Visit: Payer: Self-pay | Admitting: Family Medicine

## 2018-05-11 MED ORDER — ZOLPIDEM TARTRATE ER 6.25 MG PO TBCR: 6.2500 mg | EXTENDED_RELEASE_TABLET | Freq: Every evening | ORAL | 5 refills | Status: DC | PRN

## 2018-05-27 ENCOUNTER — Other Ambulatory Visit: Payer: Self-pay | Admitting: Family Medicine

## 2018-05-27 DIAGNOSIS — I10 Essential (primary) hypertension: Secondary | ICD-10-CM

## 2018-06-25 ENCOUNTER — Other Ambulatory Visit: Payer: Self-pay | Admitting: Family Medicine

## 2018-06-25 DIAGNOSIS — F419 Anxiety disorder, unspecified: Secondary | ICD-10-CM

## 2018-06-25 MED ORDER — ALPRAZOLAM 1 MG PO TABS: 1.0000 mg | ORAL_TABLET | Freq: Two times a day (BID) | ORAL | 5 refills | Status: DC | PRN

## 2018-08-26 ENCOUNTER — Other Ambulatory Visit: Payer: Self-pay | Admitting: Family Medicine

## 2018-08-26 DIAGNOSIS — I10 Essential (primary) hypertension: Secondary | ICD-10-CM

## 2018-08-26 MED ORDER — LOSARTAN POTASSIUM 100 MG PO TABS: 100.0000 mg | ORAL_TABLET | Freq: Every day | ORAL | 3 refills | Status: DC

## 2018-08-26 MED ORDER — ROSUVASTATIN CALCIUM 10 MG PO TABS: 10.0000 mg | ORAL_TABLET | Freq: Every day | ORAL | 3 refills | Status: DC

## 2018-10-15 ENCOUNTER — Other Ambulatory Visit: Payer: Self-pay | Admitting: Family Medicine

## 2018-10-15 DIAGNOSIS — E1122 Type 2 diabetes mellitus with diabetic chronic kidney disease: Secondary | ICD-10-CM

## 2018-10-15 DIAGNOSIS — N182 Chronic kidney disease, stage 2 (mild): Secondary | ICD-10-CM

## 2018-10-15 MED ORDER — ZOLPIDEM TARTRATE ER 6.25 MG PO TBCR: 6.2500 mg | EXTENDED_RELEASE_TABLET | Freq: Every evening | ORAL | 5 refills | Status: DC | PRN

## 2018-10-16 DIAGNOSIS — Z23 Encounter for immunization: Secondary | ICD-10-CM | POA: Diagnosis not present

## 2018-10-29 ENCOUNTER — Other Ambulatory Visit: Payer: Self-pay | Admitting: Family Medicine

## 2018-10-29 MED ORDER — HYDROCOD POLST-CPM POLST ER 10-8 MG/5ML PO SUER: 5.0000 mL | Freq: Two times a day (BID) | ORAL | 0 refills | Status: DC | PRN

## 2018-10-29 NOTE — Progress Notes (Signed)
URI.

## 2018-12-25 ENCOUNTER — Other Ambulatory Visit: Payer: Self-pay | Admitting: Family Medicine

## 2018-12-25 MED ORDER — MELOXICAM 15 MG PO TABS: 15.0000 mg | ORAL_TABLET | Freq: Every day | ORAL | 11 refills | Status: DC | PRN

## 2019-01-18 ENCOUNTER — Other Ambulatory Visit: Payer: Self-pay | Admitting: Family Medicine

## 2019-01-18 DIAGNOSIS — E1122 Type 2 diabetes mellitus with diabetic chronic kidney disease: Secondary | ICD-10-CM

## 2019-01-18 DIAGNOSIS — N182 Chronic kidney disease, stage 2 (mild): Secondary | ICD-10-CM

## 2019-01-18 MED ORDER — METFORMIN HCL 500 MG PO TABS: 500.0000 mg | ORAL_TABLET | Freq: Two times a day (BID) | ORAL | 3 refills | Status: DC

## 2019-01-18 NOTE — Progress Notes (Signed)
Refills

## 2019-02-11 ENCOUNTER — Other Ambulatory Visit: Payer: Self-pay | Admitting: Family Medicine

## 2019-02-11 DIAGNOSIS — F419 Anxiety disorder, unspecified: Secondary | ICD-10-CM

## 2019-02-11 MED ORDER — ALPRAZOLAM 1 MG PO TABS: 1.0000 mg | ORAL_TABLET | Freq: Two times a day (BID) | ORAL | 5 refills | Status: DC | PRN

## 2019-02-11 NOTE — Progress Notes (Signed)
refills  

## 2019-03-23 ENCOUNTER — Other Ambulatory Visit: Payer: Self-pay | Admitting: Family Medicine

## 2019-03-23 ENCOUNTER — Other Ambulatory Visit: Payer: Self-pay

## 2019-03-23 ENCOUNTER — Ambulatory Visit: Payer: BLUE CROSS/BLUE SHIELD | Admitting: Family Medicine

## 2019-03-23 ENCOUNTER — Telehealth: Payer: Self-pay | Admitting: Family Medicine

## 2019-03-23 ENCOUNTER — Telehealth: Payer: Self-pay | Admitting: *Deleted

## 2019-03-23 DIAGNOSIS — Z20822 Contact with and (suspected) exposure to covid-19: Secondary | ICD-10-CM

## 2019-03-23 MED ORDER — AZITHROMYCIN 250 MG PO TABS: ORAL_TABLET | ORAL | 0 refills | Status: DC

## 2019-03-23 NOTE — Telephone Encounter (Signed)
Dr. Miguel Aschoff requested COVID 19 test for patient- see Letter from 03/23/2019.  Patient scheduled for today at [facility] building at 12 pm.  Testing protocol reviewed with patient, he expressed understanding.

## 2019-03-23 NOTE — Progress Notes (Signed)
For fever,cough,covid exposure.

## 2019-03-23 NOTE — Telephone Encounter (Signed)
Chronic Bronchitis

## 2019-03-23 NOTE — Telephone Encounter (Signed)
What diagnosis is appropriate?

## 2019-03-23 NOTE — Progress Notes (Signed)
Covid exposure,cogh,fever

## 2019-03-23 NOTE — Telephone Encounter (Signed)
Elmyra Ricks with [facility] is requesting a Dx Code so they can fill the Rx for azithromycin (ZITHROMAX) 250 MG tablet. Please advise. Thanks TNP

## 2019-03-24 NOTE — Telephone Encounter (Signed)
Advised pharmacy.

## 2019-03-27 LAB — NOVEL CORONAVIRUS, NAA: SARS-CoV-2, NAA: DETECTED — AB

## 2019-04-28 ENCOUNTER — Other Ambulatory Visit: Payer: Self-pay | Admitting: Family Medicine

## 2019-04-28 MED ORDER — ZOLPIDEM TARTRATE ER 6.25 MG PO TBCR: 6.2500 mg | EXTENDED_RELEASE_TABLET | Freq: Every evening | ORAL | 5 refills | Status: DC | PRN

## 2019-07-27 NOTE — Progress Notes (Signed)
Patient: Willie Long, Male    DOB: 03/05/1956, 63 y.o.   MRN: [id number] Visit Date: 07/29/2019  Today's Provider: Megan Mansichard Gilbert Jr, MD   Chief Complaint  Patient presents with  . Annual Exam   Subjective:     Annual physical exam Willie Long is a 63 y.o. male who presents today for health maintenance and complete physical. He feels fairly well. He reports exercising not as much but does some walking and will start his routine again here in a few weeks. Marland Kitchen. He reports he is sleeping fairly well.  His last A1c at home was 4.9. He has had both the flu shot and the Shingrix shot. -----------------------------------------------------------------   Review of Systems  Constitutional: Negative.   HENT: Negative.   Eyes: Negative.   Respiratory: Negative.   Cardiovascular: Negative.   Gastrointestinal: Negative.   Musculoskeletal: Negative.   Skin: Negative.   Neurological: Negative.   Hematological: Negative.   Psychiatric/Behavioral: Negative.     Social History      He  reports that he has never smoked. He has never used smokeless tobacco. He reports current alcohol use of about 1.0 standard drinks of alcohol per week. He reports that he does not use drugs.       Social History   Socioeconomic History  . Marital status: Married    Spouse name: Not on file  . Number of children: Not on file  . Years of education: Not on file  . Highest education level: Not on file  Occupational History  . Not on file  Social Needs  . Financial resource strain: Not on file  . Food insecurity    Worry: Not on file    Inability: Not on file  . Transportation needs    Medical: Not on file    Non-medical: Not on file  Tobacco Use  . Smoking status: Never Smoker  . Smokeless tobacco: Never Used  Substance and Sexual Activity  . Alcohol use: Yes    Alcohol/week: 1.0 standard drinks    Types: 1 Cans of beer per week  . Drug use: No  . Sexual activity: Not on file  Lifestyle   . Physical activity    Days per week: Not on file    Minutes per session: Not on file  . Stress: Not on file  Relationships  . Social Musicianconnections    Talks on phone: Not on file    Gets together: Not on file    Attends religious service: Not on file    Active member of club or organization: Not on file    Attends meetings of clubs or organizations: Not on file    Relationship status: Not on file  Other Topics Concern  . Not on file  Social History Narrative  . Not on file    No past medical history on file.   Patient Active Problem List   Diagnosis Date Noted  . Type 2 diabetes mellitus (HCC) 10/18/2015  . Anxiety 10/03/2015  . Esophageal reflux 10/03/2015  . Gout 10/03/2015  . Familial multiple lipoprotein-type hyperlipidemia 10/03/2015  . Male hypogonadism 10/03/2015  . Cannot sleep 10/03/2015  . Arthritis, degenerative 10/03/2015  . Peroneal tendinitis 09/28/2015  . Bronchitis 03/23/2015  . DDD (degenerative disc disease), cervical 03/09/2015    Past Surgical History:  Procedure Laterality Date  . CHOLECYSTECTOMY    . DENTAL SURGERY      Family History  Family Status  Relation Name Status  . Mother  Deceased  . Father  Deceased  . Sister  Alive  . PGM  (Not Specified)  . PGF  (Not Specified)        His family history includes Arthritis in his father and mother; Endometrial cancer in his mother; Healthy in his sister; Heart disease in his paternal grandmother; Hyperlipidemia in his mother; Liver cancer in his mother; Prostate cancer in his father; Stroke in his father and paternal grandfather.      Allergies  Allergen Reactions  . Oyster Extract Nausea And Vomiting     Current Outpatient Medications:  .  ALPRAZolam (XANAX) 1 MG tablet, Take 1 tablet (1 mg total) by mouth 2 (two) times daily as needed for anxiety., Disp: 60 tablet, Rfl: 5 .  aspirin 81 MG tablet, Take by mouth. Reported on 10/26/2015, Disp: , Rfl:  .  cholecalciferol (VITAMIN D3)  25 MCG (1000 UT) tablet, Take 1,000 Units by mouth daily. 5 days a week, Disp: , Rfl:  .  empagliflozin (JARDIANCE) 25 MG TABS tablet, Take 25 mg by mouth daily., Disp: , Rfl:  .  losartan (COZAAR) 100 MG tablet, Take 1 tablet (100 mg total) by mouth daily., Disp: 90 tablet, Rfl: 3 .  meloxicam (MOBIC) 15 MG tablet, Take 1 tablet (15 mg total) by mouth daily as needed for pain., Disp: 30 tablet, Rfl: 11 .  metFORMIN (GLUCOPHAGE) 500 MG tablet, Take 1 tablet (500 mg total) by mouth 2 (two) times daily., Disp: 180 tablet, Rfl: 3 .  Multiple Vitamins-Minerals (PRESERVISION AREDS) CAPS, Take by mouth., Disp: , Rfl:  .  rosuvastatin (CRESTOR) 10 MG tablet, Take 1 tablet (10 mg total) by mouth at bedtime., Disp: 90 tablet, Rfl: 3 .  zolpidem (AMBIEN CR) 6.25 MG CR tablet, Take 1 tablet (6.25 mg total) by mouth at bedtime as needed. for sleep, Disp: 30 tablet, Rfl: 5   Patient Care Team: Maple Hudson., MD as PCP - General (Family Medicine)    Objective:    Vitals: BP 123/79   Pulse 68   Temp 97.7 F (36.5 C) (Temporal)   Resp 18   Ht 5\' 10"  (1.778 m)   Wt 199 lb (90.3 kg)   BMI 28.55 kg/m    Vitals:   07/29/19 1621  BP: 123/79  Pulse: 68  Resp: 18  Temp: 97.7 F (36.5 C)  TempSrc: Temporal  Weight: 199 lb (90.3 kg)  Height: 5\' 10"  (1.778 m)     Physical Exam Vitals signs reviewed.  Constitutional:      Appearance: Normal appearance.  HENT:     Head: Normocephalic and atraumatic.     Right Ear: External ear normal.     Left Ear: External ear normal.     Mouth/Throat:     Mouth: Mucous membranes are moist.     Pharynx: Oropharynx is clear.  Eyes:     General: No scleral icterus.    Conjunctiva/sclera: Conjunctivae normal.  Neck:     Vascular: No carotid bruit.  Cardiovascular:     Rate and Rhythm: Normal rate and regular rhythm.     Pulses: Normal pulses.     Heart sounds: Normal heart sounds.  Pulmonary:     Effort: Pulmonary effort is normal.     Breath  sounds: Normal breath sounds.  Abdominal:     Palpations: Abdomen is soft.  Genitourinary:    Penis: Normal.      Scrotum/Testes: Normal.  Prostate: Normal.     Rectum: Normal.  Musculoskeletal:     Right lower leg: No edema.     Left lower leg: No edema.  Lymphadenopathy:     Cervical: No cervical adenopathy.  Skin:    General: Skin is warm and dry.  Neurological:     General: No focal deficit present.     Mental Status: He is alert and oriented to person, place, and time.     Comments: Normal monofilament exam of the feet.  Psychiatric:        Mood and Affect: Mood normal.        Behavior: Behavior normal.        Thought Content: Thought content normal.        Judgment: Judgment normal.      Depression Screen PHQ 2/9 Scores 07/29/2019 06/05/2017 10/26/2015  PHQ - 2 Score 0 0 0  PHQ- 9 Score 0 0 -       Assessment & Plan:     Routine Health Maintenance and Physical Exam  Exercise Activities and Dietary recommendations Goals   None     Immunization History  Administered Date(s) Administered  . Hepatitis B 05/30/2011, 06/27/2011, 11/28/2011  . Hepatitis B, adult 05/30/2011, 06/27/2011, 11/28/2011  . Influenza,inj,Quad PF,6+ Mos 07/01/2015  . Influenza,inj,quad, With Preservative 07/21/2019  . Tdap 05/16/2011  . Zoster Recombinat (Shingrix) 10/16/2018, 02/12/2019    Health Maintenance  Topic Date Due  . Hepatitis C Screening  [birth year]  . HIV Screening  06/06/1972  . HEMOGLOBIN A1C  07/26/2016  . OPHTHALMOLOGY EXAM  11/02/2019 (Originally 07/17/2018)  . FOOT EXAM  07/28/2020 (Originally 06/07/1967)  . COLONOSCOPY  07/28/2020 (Originally 06/07/2007)  . PNEUMOCOCCAL POLYSACCHARIDE VACCINE AGE 41-64 HIGH RISK  07/28/2022 (Originally 06/07/1959)  . TETANUS/TDAP  05/15/2021  . INFLUENZA VACCINE  Completed     Discussed health benefits of physical activity, and encouraged him to engage in regular exercise appropriate for his age and condition.   1. Annual  physical exam   2. Encounter for other general examination   3. Type 2 diabetes mellitus with stage 2 chronic kidney disease, without long-term current use of insulin (HCC) Jardiance - POCT glycosylated hemoglobin (Hb A1C)--4.9 - Comprehensive metabolic panel  4. HTN  - CBC with Differential/Platelet - Comprehensive metabolic panel - Lipid panel - PSA - TSH  5. Male hypogonadism  - TSH  6. Prostate cancer screening  - PSA  7. Hyperlipidemia, unspecified hyperlipidemia type On Crestor. - Lipid panel    Wilhemena Durie, MD  [facility]

## 2019-07-29 ENCOUNTER — Ambulatory Visit (INDEPENDENT_AMBULATORY_CARE_PROVIDER_SITE_OTHER): Payer: BC Managed Care – PPO | Admitting: Family Medicine

## 2019-07-29 ENCOUNTER — Other Ambulatory Visit: Payer: Self-pay

## 2019-07-29 ENCOUNTER — Encounter: Payer: Self-pay | Admitting: Family Medicine

## 2019-07-29 VITALS — BP 123/79 | HR 68 | Temp 97.7°F | Resp 18 | Ht 70.0 in | Wt 199.0 lb

## 2019-07-29 DIAGNOSIS — Z125 Encounter for screening for malignant neoplasm of prostate: Secondary | ICD-10-CM

## 2019-07-29 DIAGNOSIS — Z0000 Encounter for general adult medical examination without abnormal findings: Secondary | ICD-10-CM

## 2019-07-29 DIAGNOSIS — Z008 Encounter for other general examination: Secondary | ICD-10-CM | POA: Diagnosis not present

## 2019-07-29 DIAGNOSIS — E785 Hyperlipidemia, unspecified: Secondary | ICD-10-CM

## 2019-07-29 DIAGNOSIS — N182 Chronic kidney disease, stage 2 (mild): Secondary | ICD-10-CM | POA: Diagnosis not present

## 2019-07-29 DIAGNOSIS — E1122 Type 2 diabetes mellitus with diabetic chronic kidney disease: Secondary | ICD-10-CM | POA: Diagnosis not present

## 2019-07-29 DIAGNOSIS — E291 Testicular hypofunction: Secondary | ICD-10-CM

## 2019-07-29 LAB — POCT GLYCOSYLATED HEMOGLOBIN (HGB A1C)
Est. average glucose Bld gHb Est-mCnc: 94
Hemoglobin A1C: 4.9 % (ref 4.0–5.6)

## 2019-07-29 NOTE — Patient Instructions (Signed)
Return in 1 year for CPE.

## 2019-07-30 DIAGNOSIS — Z0000 Encounter for general adult medical examination without abnormal findings: Secondary | ICD-10-CM | POA: Diagnosis not present

## 2019-07-30 DIAGNOSIS — N182 Chronic kidney disease, stage 2 (mild): Secondary | ICD-10-CM | POA: Diagnosis not present

## 2019-07-30 DIAGNOSIS — E785 Hyperlipidemia, unspecified: Secondary | ICD-10-CM | POA: Diagnosis not present

## 2019-07-30 DIAGNOSIS — E1122 Type 2 diabetes mellitus with diabetic chronic kidney disease: Secondary | ICD-10-CM | POA: Diagnosis not present

## 2019-07-31 LAB — CBC WITH DIFFERENTIAL/PLATELET
Basophils Absolute: 0 10*3/uL (ref 0.0–0.2)
Basos: 1 %
EOS (ABSOLUTE): 0.2 10*3/uL (ref 0.0–0.4)
Eos: 4 %
Hematocrit: 44.6 % (ref 37.5–51.0)
Hemoglobin: 15.2 g/dL (ref 13.0–17.7)
Immature Grans (Abs): 0 10*3/uL (ref 0.0–0.1)
Immature Granulocytes: 1 %
Lymphocytes Absolute: 0.8 10*3/uL (ref 0.7–3.1)
Lymphs: 21 %
MCH: 32.3 pg (ref 26.6–33.0)
MCHC: 34.1 g/dL (ref 31.5–35.7)
MCV: 95 fL (ref 79–97)
Monocytes Absolute: 0.3 10*3/uL (ref 0.1–0.9)
Monocytes: 7 %
Neutrophils Absolute: 2.7 10*3/uL (ref 1.4–7.0)
Neutrophils: 66 %
Platelets: 151 10*3/uL (ref 150–450)
RBC: 4.7 x10E6/uL (ref 4.14–5.80)
RDW: 12.8 % (ref 11.6–15.4)
WBC: 4 10*3/uL (ref 3.4–10.8)

## 2019-07-31 LAB — LIPID PANEL
Chol/HDL Ratio: 3.6 ratio (ref 0.0–5.0)
Cholesterol, Total: 114 mg/dL (ref 100–199)
HDL: 32 mg/dL — ABNORMAL LOW (ref >39)
LDL Chol Calc (NIH): 68 mg/dL (ref 0–99)
Triglycerides: 66 mg/dL (ref 0–149)
VLDL Cholesterol Cal: 14 mg/dL (ref 5–40)

## 2019-07-31 LAB — COMPREHENSIVE METABOLIC PANEL
ALT: 57 IU/L — ABNORMAL HIGH (ref 0–44)
AST: 27 IU/L (ref 0–40)
Albumin/Globulin Ratio: 2.4 — ABNORMAL HIGH (ref 1.2–2.2)
Albumin: 5.3 g/dL — ABNORMAL HIGH (ref 3.8–4.8)
Alkaline Phosphatase: 81 IU/L (ref 39–117)
BUN/Creatinine Ratio: 13 (ref 10–24)
BUN: 13 mg/dL (ref 8–27)
Bilirubin Total: 1 mg/dL (ref 0.0–1.2)
CO2: 24 mmol/L (ref 20–29)
Calcium: 9.8 mg/dL (ref 8.6–10.2)
Chloride: 101 mmol/L (ref 96–106)
Creatinine, Ser: 1.04 mg/dL (ref 0.76–1.27)
GFR calc Af Amer: 89 mL/min/{1.73_m2} (ref >59)
GFR calc non Af Amer: 77 mL/min/{1.73_m2} (ref >59)
Globulin, Total: 2.2 g/dL (ref 1.5–4.5)
Glucose: 105 mg/dL — ABNORMAL HIGH (ref 65–99)
Potassium: 4.4 mmol/L (ref 3.5–5.2)
Sodium: 141 mmol/L (ref 134–144)
Total Protein: 7.5 g/dL (ref 6.0–8.5)

## 2019-07-31 LAB — PSA: Prostate Specific Ag, Serum: 2.9 ng/mL (ref 0.0–4.0)

## 2019-07-31 LAB — TSH: TSH: 1.16 u[IU]/mL (ref 0.450–4.500)

## 2019-08-11 ENCOUNTER — Other Ambulatory Visit: Payer: Self-pay | Admitting: Family Medicine

## 2019-08-11 DIAGNOSIS — I10 Essential (primary) hypertension: Secondary | ICD-10-CM

## 2019-08-11 DIAGNOSIS — E7800 Pure hypercholesterolemia, unspecified: Secondary | ICD-10-CM

## 2019-08-11 DIAGNOSIS — E1122 Type 2 diabetes mellitus with diabetic chronic kidney disease: Secondary | ICD-10-CM

## 2019-08-11 DIAGNOSIS — N182 Chronic kidney disease, stage 2 (mild): Secondary | ICD-10-CM

## 2019-08-11 MED ORDER — ROSUVASTATIN CALCIUM 10 MG PO TABS: 10.0000 mg | ORAL_TABLET | Freq: Every day | ORAL | 3 refills | Status: DC

## 2019-08-11 MED ORDER — METFORMIN HCL 500 MG PO TABS: 500.0000 mg | ORAL_TABLET | Freq: Two times a day (BID) | ORAL | 3 refills | Status: DC

## 2019-08-11 MED ORDER — LOSARTAN POTASSIUM 100 MG PO TABS: 100.0000 mg | ORAL_TABLET | Freq: Every day | ORAL | 3 refills | Status: DC

## 2019-09-21 ENCOUNTER — Other Ambulatory Visit: Payer: Self-pay | Admitting: Family Medicine

## 2019-09-21 DIAGNOSIS — F419 Anxiety disorder, unspecified: Secondary | ICD-10-CM

## 2019-09-21 MED ORDER — ALPRAZOLAM 1 MG PO TABS: 1.0000 mg | ORAL_TABLET | Freq: Two times a day (BID) | ORAL | 5 refills | Status: DC | PRN

## 2019-11-02 DIAGNOSIS — E119 Type 2 diabetes mellitus without complications: Secondary | ICD-10-CM | POA: Diagnosis not present

## 2019-11-02 LAB — HM DIABETES EYE EXAM

## 2019-11-04 ENCOUNTER — Encounter: Payer: Self-pay | Admitting: *Deleted

## 2019-11-22 ENCOUNTER — Other Ambulatory Visit: Payer: Self-pay | Admitting: Family Medicine

## 2019-11-22 DIAGNOSIS — F5101 Primary insomnia: Secondary | ICD-10-CM

## 2019-11-22 MED ORDER — ZOLPIDEM TARTRATE ER 6.25 MG PO TBCR: 6.2500 mg | EXTENDED_RELEASE_TABLET | Freq: Every evening | ORAL | 5 refills | Status: DC | PRN

## 2020-02-02 ENCOUNTER — Other Ambulatory Visit: Payer: Self-pay | Admitting: Family Medicine

## 2020-02-02 DIAGNOSIS — J301 Allergic rhinitis due to pollen: Secondary | ICD-10-CM

## 2020-02-02 MED ORDER — FLUTICASONE PROPIONATE 50 MCG/ACT NA SUSP: 2.0000 | Freq: Every day | NASAL | 11 refills | Status: AC

## 2020-04-27 ENCOUNTER — Other Ambulatory Visit: Payer: Self-pay | Admitting: Family Medicine

## 2020-04-27 DIAGNOSIS — F419 Anxiety disorder, unspecified: Secondary | ICD-10-CM

## 2020-04-27 MED ORDER — ALPRAZOLAM 1 MG PO TABS: 1.0000 mg | ORAL_TABLET | Freq: Two times a day (BID) | ORAL | 5 refills | Status: DC | PRN

## 2020-05-04 ENCOUNTER — Other Ambulatory Visit: Payer: Self-pay | Admitting: Family Medicine

## 2020-05-04 DIAGNOSIS — N182 Chronic kidney disease, stage 2 (mild): Secondary | ICD-10-CM

## 2020-05-04 DIAGNOSIS — E1122 Type 2 diabetes mellitus with diabetic chronic kidney disease: Secondary | ICD-10-CM

## 2020-05-04 DIAGNOSIS — I10 Essential (primary) hypertension: Secondary | ICD-10-CM

## 2020-06-22 ENCOUNTER — Other Ambulatory Visit: Payer: Self-pay | Admitting: Family Medicine

## 2020-06-22 MED ORDER — ZOLPIDEM TARTRATE ER 6.25 MG PO TBCR: 6.2500 mg | EXTENDED_RELEASE_TABLET | Freq: Every evening | ORAL | 5 refills | Status: DC | PRN

## 2020-08-03 ENCOUNTER — Encounter: Payer: Self-pay | Admitting: Family Medicine

## 2020-08-03 ENCOUNTER — Ambulatory Visit (INDEPENDENT_AMBULATORY_CARE_PROVIDER_SITE_OTHER): Payer: BC Managed Care – PPO | Admitting: Family Medicine

## 2020-08-03 ENCOUNTER — Other Ambulatory Visit: Payer: Self-pay

## 2020-08-03 VITALS — BP 127/68 | HR 79 | Temp 98.3°F | Resp 16 | Ht 70.0 in | Wt 194.0 lb

## 2020-08-03 DIAGNOSIS — E7800 Pure hypercholesterolemia, unspecified: Secondary | ICD-10-CM

## 2020-08-03 DIAGNOSIS — N182 Chronic kidney disease, stage 2 (mild): Secondary | ICD-10-CM | POA: Diagnosis not present

## 2020-08-03 DIAGNOSIS — Z125 Encounter for screening for malignant neoplasm of prostate: Secondary | ICD-10-CM

## 2020-08-03 DIAGNOSIS — I10 Essential (primary) hypertension: Secondary | ICD-10-CM

## 2020-08-03 DIAGNOSIS — Z1211 Encounter for screening for malignant neoplasm of colon: Secondary | ICD-10-CM

## 2020-08-03 DIAGNOSIS — Z0000 Encounter for general adult medical examination without abnormal findings: Secondary | ICD-10-CM

## 2020-08-03 DIAGNOSIS — E1122 Type 2 diabetes mellitus with diabetic chronic kidney disease: Secondary | ICD-10-CM | POA: Diagnosis not present

## 2020-08-03 NOTE — Progress Notes (Signed)
I,April Miller,acting as a scribe for Megan Mans, MD.,have documented all relevant documentation on the behalf of Megan Mans, MD,as directed by  Megan Mans, MD while in the presence of Megan Mans, MD.   Complete physical exam   Patient: Willie Long   DOB: 08/15/1956   64 y.o. Male  MRN: [id number] Visit Date: 08/03/2020  Today's healthcare provider: Megan Mans, MD   Chief Complaint  Patient presents with  . Annual Exam   Subjective    Willie Long is a 64 y.o. male who presents today for a complete physical exam.  He reports consuming a general diet. Home exercise routine includes Gym. He generally feels well. He reports sleeping well. He does not have additional problems to discuss today.  HPI  He is doing exceptionally well with his diabetes with his last A1c less than 5. He does have a lesion on his left second toe and complains of pain in his right forefoot.  History reviewed. No pertinent past medical history. Past Surgical History:  Procedure Laterality Date  . CHOLECYSTECTOMY    . DENTAL SURGERY     Social History   Socioeconomic History  . Marital status: Married    Spouse name: Not on file  . Number of children: Not on file  . Years of education: Not on file  . Highest education level: Not on file  Occupational History  . Not on file  Tobacco Use  . Smoking status: Never Smoker  . Smokeless tobacco: Never Used  Vaping Use  . Vaping Use: Never used  Substance and Sexual Activity  . Alcohol use: Yes    Alcohol/week: 1.0 standard drink    Types: 1 Cans of beer per week  . Drug use: No  . Sexual activity: Not on file  Other Topics Concern  . Not on file  Social History Narrative  . Not on file   Social Determinants of Health   Financial Resource Strain:   . Difficulty of Paying Living Expenses: Not on file  Food Insecurity:   . Worried About Programme researcher, broadcasting/film/video in the Last Year: Not on file  . Ran Out of  Food in the Last Year: Not on file  Transportation Needs:   . Lack of Transportation (Medical): Not on file  . Lack of Transportation (Non-Medical): Not on file  Physical Activity:   . Days of Exercise per Week: Not on file  . Minutes of Exercise per Session: Not on file  Stress:   . Feeling of Stress : Not on file  Social Connections:   . Frequency of Communication with Friends and Family: Not on file  . Frequency of Social Gatherings with Friends and Family: Not on file  . Attends Religious Services: Not on file  . Active Member of Clubs or Organizations: Not on file  . Attends Banker Meetings: Not on file  . Marital Status: Not on file  Intimate Partner Violence:   . Fear of Current or Ex-Partner: Not on file  . Emotionally Abused: Not on file  . Physically Abused: Not on file  . Sexually Abused: Not on file   Family Status  Relation Name Status  . Mother  Deceased  . Father  Deceased  . Sister  Alive  . PGM  (Not Specified)  . PGF  (Not Specified)   Family History  Problem Relation Age of Onset  . Arthritis Mother   . Hyperlipidemia  Mother   . Liver cancer Mother   . Endometrial cancer Mother   . Arthritis Father   . Prostate cancer Father   . Stroke Father   . Healthy Sister   . Heart disease Paternal Grandmother   . Stroke Paternal Grandfather    Allergies  Allergen Reactions  . Oyster Extract Nausea And Vomiting    Patient Care Team: Maple Hudson., MD as PCP - General (Family Medicine)   Medications: Outpatient Medications Prior to Visit  Medication Sig  . ALPRAZolam (XANAX) 1 MG tablet Take 1 tablet (1 mg total) by mouth 2 (two) times daily as needed for anxiety.  Marland Kitchen aspirin 81 MG tablet Take by mouth. Reported on 10/26/2015  . cholecalciferol (VITAMIN D3) 25 MCG (1000 UT) tablet Take 1,000 Units by mouth daily. 5 days a week  . empagliflozin (JARDIANCE) 25 MG TABS tablet Take 25 mg by mouth daily.  . fluticasone (FLONASE) 50  MCG/ACT nasal spray Place 2 sprays into both nostrils daily.  Marland Kitchen losartan (COZAAR) 100 MG tablet TAKE ONE TABLET BY MOUTH EVERY DAY  . meloxicam (MOBIC) 15 MG tablet Take 1 tablet (15 mg total) by mouth daily as needed for pain.  . metFORMIN (GLUCOPHAGE) 500 MG tablet TAKE ONE TABLET BY MOUTH TWICE DAILY  . Multiple Vitamins-Minerals (PRESERVISION AREDS) CAPS Take by mouth.  . rosuvastatin (CRESTOR) 10 MG tablet TAKE 1 TABLET BY MOUTH DAILY  . zolpidem (AMBIEN CR) 6.25 MG CR tablet Take 1 tablet (6.25 mg total) by mouth at bedtime as needed. for sleep   No facility-administered medications prior to visit.    Review of Systems  HENT: Positive for postnasal drip.   All other systems reviewed and are negative.      Objective    BP 127/68 (BP Location: Right Arm, Patient Position: Sitting, Cuff Size: Large)   Pulse 79   Temp 98.3 F (36.8 C) (Oral)   Resp 16   Ht 5\' 10"  (1.778 m)   Wt 194 lb (88 kg)   SpO2 99%   BMI 27.84 kg/m     Physical Exam Vitals reviewed.  Constitutional:      Appearance: Normal appearance.  HENT:     Head: Normocephalic and atraumatic.     Right Ear: External ear normal.     Left Ear: External ear normal.     Mouth/Throat:     Mouth: Mucous membranes are moist.     Pharynx: Oropharynx is clear.  Eyes:     General: No scleral icterus.    Conjunctiva/sclera: Conjunctivae normal.  Neck:     Vascular: No carotid bruit.  Cardiovascular:     Rate and Rhythm: Normal rate and regular rhythm.     Pulses: Normal pulses.     Heart sounds: Normal heart sounds.  Pulmonary:     Effort: Pulmonary effort is normal.     Breath sounds: Normal breath sounds.  Abdominal:     Palpations: Abdomen is soft.  Genitourinary:    Penis: Normal.      Testes: Normal.  Musculoskeletal:     Right lower leg: No edema.     Left lower leg: No edema.     Comments: He is tender on the plantar foot under the area of the second metatarsal head.  It is mild tenderness.  No  crepitance, no swelling, no erythema.  Lymphadenopathy:     Cervical: No cervical adenopathy.  Skin:    General: Skin is warm and dry.  Comments: There appears to be a corn on the left second toe.  No secondary infection. Several small lipomas on the trunk and upper extremities  Neurological:     General: No focal deficit present.     Mental Status: He is alert and oriented to person, place, and time.     Comments: Normal monofilament exam of the feet.  Psychiatric:        Mood and Affect: Mood normal.        Behavior: Behavior normal.        Thought Content: Thought content normal.        Judgment: Judgment normal.       Last depression screening scores PHQ 2/9 Scores 08/03/2020 07/29/2019 06/05/2017  PHQ - 2 Score 0 0 0  PHQ- 9 Score 0 0 0   Last fall risk screening Fall Risk  07/29/2019  Falls in the past year? 0  Number falls in past yr: 0  Injury with Fall? 0  Follow up Falls evaluation completed   Last Audit-C alcohol use screening Alcohol Use Disorder Test (AUDIT) 08/03/2020  1. How often do you have a drink containing alcohol? 3  2. How many drinks containing alcohol do you have on a typical day when you are drinking? 0  3. How often do you have six or more drinks on one occasion? 0  AUDIT-C Score 3  4. How often during the last year have you found that you were not able to stop drinking once you had started? 0  5. How often during the last year have you failed to do what was normally expected from you because of drinking? 0  6. How often during the last year have you needed a first drink in the morning to get yourself going after a heavy drinking session? 0  7. How often during the last year have you had a feeling of guilt of remorse after drinking? 0  8. How often during the last year have you been unable to remember what happened the night before because you had been drinking? 0  9. Have you or someone else been injured as a result of your drinking? 0  10. Has a  relative or friend or a doctor or another health worker been concerned about your drinking or suggested you cut down? 0  Alcohol Use Disorder Identification Test Final Score (AUDIT) 3  Alcohol Brief Interventions/Follow-up AUDIT Score <7 follow-up not indicated   A score of 3 or more in women, and 4 or more in men indicates increased risk for alcohol abuse, EXCEPT if all of the points are from question 1   No results found for any visits on 08/03/20.  Assessment & Plan    Routine Health Maintenance and Physical Exam  Exercise Activities and Dietary recommendations Goals   None     Immunization History  Administered Date(s) Administered  . Hepatitis B 05/30/2011, 06/27/2011, 11/28/2011  . Hepatitis B, adult 05/30/2011, 06/27/2011, 11/28/2011  . Influenza,inj,Quad PF,6+ Mos 07/01/2015  . Influenza,inj,quad, With Preservative 07/21/2019  . Influenza-Unspecified 07/14/2020  . Moderna SARS-COVID-2 Vaccination 06/23/2020  . PFIZER SARS-COV-2 Vaccination 10/07/2019, 10/28/2019  . Tdap 05/16/2011  . Zoster Recombinat (Shingrix) 10/16/2018, 02/12/2019    Health Maintenance  Topic Date Due  . Hepatitis C Screening  Never done  . FOOT EXAM  Never done  . HIV Screening  Never done  . COLONOSCOPY  Never done  . HEMOGLOBIN A1C  01/27/2020  . PNEUMOCOCCAL POLYSACCHARIDE VACCINE AGE 38-64 HIGH  RISK  07/28/2022 (Originally [birth year]/1959)  . OPHTHALMOLOGY EXAM  11/01/2020  . TETANUS/TDAP  05/15/2021  . INFLUENZA VACCINE  Completed  . COVID-19 Vaccine  Completed    Discussed health benefits of physical activity, and encouraged him to engage in regular exercise appropriate for his age and condition.  1. Annual physical exam  - Lipid panel - CBC w/Diff/Platelet - Comprehensive Metabolic Panel (CMET) - TSH - Hemoglobin A1c  2. Type 2 diabetes mellitus with stage 2 chronic kidney disease, without long-term current use of insulin (HCC) A1c is a bit under 5 over the past year.  Consider  stopping Jardiance in the future.  We will continue continue Metformin for now. - Lipid panel - CBC w/Diff/Platelet - Comprehensive Metabolic Panel (CMET) - TSH - Hemoglobin A1c  3. Essential hypertension  - Lipid panel - CBC w/Diff/Platelet - Comprehensive Metabolic Panel (CMET) - TSH - Hemoglobin A1c  4. Pure hypercholesterolemia  - Lipid panel - CBC w/Diff/Platelet - Comprehensive Metabolic Panel (CMET) - TSH - Hemoglobin A1c  5. Screening for prostate cancer  - PSA  6. Screening for colon cancer Refer to Dr. Servando SnareWohl. - Ambulatory referral to Gastroenterology 7 foot pain/metatarsalgia/possible Morton's neuroma  8.  Corn of foot/left second toe    Return in about 1 year (around 08/03/2021).        Vianka Ertel Wendelyn BreslowGilbert Jr, MD  [facility] [phone] (phone) [phone] (fax)  [facility]

## 2020-08-04 LAB — CBC WITH DIFFERENTIAL/PLATELET
Basophils Absolute: 0 10*3/uL (ref 0.0–0.2)
Basos: 0 %
EOS (ABSOLUTE): 0.1 10*3/uL (ref 0.0–0.4)
Eos: 2 %
Hematocrit: 43.1 % (ref 37.5–51.0)
Hemoglobin: 15.2 g/dL (ref 13.0–17.7)
Immature Grans (Abs): 0 10*3/uL (ref 0.0–0.1)
Immature Granulocytes: 0 %
Lymphocytes Absolute: 0.9 10*3/uL (ref 0.7–3.1)
Lymphs: 18 %
MCH: 34.7 pg — ABNORMAL HIGH (ref 26.6–33.0)
MCHC: 35.3 g/dL (ref 31.5–35.7)
MCV: 98 fL — ABNORMAL HIGH (ref 79–97)
Monocytes Absolute: 0.4 10*3/uL (ref 0.1–0.9)
Monocytes: 7 %
Neutrophils Absolute: 3.6 10*3/uL (ref 1.4–7.0)
Neutrophils: 73 %
Platelets: 111 10*3/uL — ABNORMAL LOW (ref 150–450)
RBC: 4.38 x10E6/uL (ref 4.14–5.80)
RDW: 12.9 % (ref 11.6–15.4)
WBC: 5 10*3/uL (ref 3.4–10.8)

## 2020-08-04 LAB — COMPREHENSIVE METABOLIC PANEL
ALT: 58 IU/L — ABNORMAL HIGH (ref 0–44)
AST: 26 IU/L (ref 0–40)
Albumin/Globulin Ratio: 2.7 — ABNORMAL HIGH (ref 1.2–2.2)
Albumin: 5.3 g/dL — ABNORMAL HIGH (ref 3.8–4.8)
Alkaline Phosphatase: 69 IU/L (ref 44–121)
BUN/Creatinine Ratio: 15 (ref 10–24)
BUN: 14 mg/dL (ref 8–27)
Bilirubin Total: 2 mg/dL — ABNORMAL HIGH (ref 0.0–1.2)
CO2: 22 mmol/L (ref 20–29)
Calcium: 9.7 mg/dL (ref 8.6–10.2)
Chloride: 100 mmol/L (ref 96–106)
Creatinine, Ser: 0.96 mg/dL (ref 0.76–1.27)
GFR calc Af Amer: 97 mL/min/{1.73_m2} (ref >59)
GFR calc non Af Amer: 84 mL/min/{1.73_m2} (ref >59)
Globulin, Total: 2 g/dL (ref 1.5–4.5)
Glucose: 90 mg/dL (ref 65–99)
Potassium: 4 mmol/L (ref 3.5–5.2)
Sodium: 138 mmol/L (ref 134–144)
Total Protein: 7.3 g/dL (ref 6.0–8.5)

## 2020-08-04 LAB — PSA: Prostate Specific Ag, Serum: 3.1 ng/mL (ref 0.0–4.0)

## 2020-08-04 LAB — LIPID PANEL
Chol/HDL Ratio: 3.6 ratio (ref 0.0–5.0)
Cholesterol, Total: 135 mg/dL (ref 100–199)
HDL: 38 mg/dL — ABNORMAL LOW (ref >39)
LDL Chol Calc (NIH): 80 mg/dL (ref 0–99)
Triglycerides: 85 mg/dL (ref 0–149)
VLDL Cholesterol Cal: 17 mg/dL (ref 5–40)

## 2020-08-04 LAB — HEMOGLOBIN A1C
Est. average glucose Bld gHb Est-mCnc: 91 mg/dL
Hgb A1c MFr Bld: 4.8 % (ref 4.8–5.6)

## 2020-08-04 LAB — TSH: TSH: 0.748 u[IU]/mL (ref 0.450–4.500)

## 2020-08-08 ENCOUNTER — Other Ambulatory Visit: Payer: Self-pay | Admitting: Family Medicine

## 2020-08-08 DIAGNOSIS — E1122 Type 2 diabetes mellitus with diabetic chronic kidney disease: Secondary | ICD-10-CM

## 2020-08-08 DIAGNOSIS — N182 Chronic kidney disease, stage 2 (mild): Secondary | ICD-10-CM

## 2020-08-08 DIAGNOSIS — I10 Essential (primary) hypertension: Secondary | ICD-10-CM

## 2020-08-08 MED ORDER — ROSUVASTATIN CALCIUM 10 MG PO TABS
10.0000 mg | ORAL_TABLET | Freq: Every day | ORAL | 3 refills | Status: DC
Start: 2020-08-08 — End: 2021-08-07

## 2020-08-08 MED ORDER — METFORMIN HCL 500 MG PO TABS: 500.0000 mg | ORAL_TABLET | Freq: Two times a day (BID) | ORAL | 3 refills | Status: DC

## 2020-08-08 MED ORDER — LOSARTAN POTASSIUM 100 MG PO TABS: 100.0000 mg | ORAL_TABLET | Freq: Every day | ORAL | 3 refills | Status: DC

## 2020-08-17 ENCOUNTER — Other Ambulatory Visit: Payer: Self-pay

## 2020-08-17 ENCOUNTER — Telehealth (INDEPENDENT_AMBULATORY_CARE_PROVIDER_SITE_OTHER): Payer: Self-pay | Admitting: Gastroenterology

## 2020-08-17 DIAGNOSIS — Z1211 Encounter for screening for malignant neoplasm of colon: Secondary | ICD-10-CM

## 2020-08-17 NOTE — Progress Notes (Signed)
Patient is not ready to schedule yet due to his work schedule.  I will prepare instructions and mail to him along with rx for bowel prep.  I've asked him to call our office back when he is ready to schedule. Colonoscopy triage completed.  Gastroenterology Pre-Procedure Review   PATIENT REVIEW QUESTIONS: The patient responded to the following health history questions as indicated:    1. Are you having any GI issues? no 2. Do you have a personal history of Polyps? no 3. Do you have a family history of Colon Cancer or Polyps? yes (Father Colon polyps) 4. Diabetes Mellitus? yes (Type 2 oral meds) 5. Joint replacements in the past 12 months?no 6. Major health problems in the past 3 months?no 7. Any artificial heart valves, MVP, or defibrillator?no    MEDICATIONS & ALLERGIES:    Patient reports the following regarding taking any anticoagulation/antiplatelet therapy:   Plavix, Coumadin, Eliquis, Xarelto, Lovenox, Pradaxa, Brilinta, or Effient? no Aspirin? yes (81 mg daily)  Patient confirms/reports the following medications:  Current Outpatient Medications  Medication Sig Dispense Refill  . ALPRAZolam (XANAX) 1 MG tablet Take 1 tablet (1 mg total) by mouth 2 (two) times daily as needed for anxiety. 60 tablet 5  . aspirin 81 MG tablet Take by mouth. Reported on 10/26/2015    . cholecalciferol (VITAMIN D3) 25 MCG (1000 UT) tablet Take 1,000 Units by mouth daily. 5 days a week    . empagliflozin (JARDIANCE) 25 MG TABS tablet Take 25 mg by mouth daily.    . fluticasone (FLONASE) 50 MCG/ACT nasal spray Place 2 sprays into both nostrils daily. 16 g 11  . losartan (COZAAR) 100 MG tablet Take 1 tablet (100 mg total) by mouth daily. 90 tablet 3  . meloxicam (MOBIC) 15 MG tablet Take 1 tablet (15 mg total) by mouth daily as needed for pain. 30 tablet 11  . metFORMIN (GLUCOPHAGE) 500 MG tablet Take 1 tablet (500 mg total) by mouth 2 (two) times daily. 180 tablet 3  . Multiple Vitamins-Minerals  (PRESERVISION AREDS) CAPS Take by mouth.    . rosuvastatin (CRESTOR) 10 MG tablet Take 1 tablet (10 mg total) by mouth daily. 90 tablet 3  . zolpidem (AMBIEN CR) 6.25 MG CR tablet Take 1 tablet (6.25 mg total) by mouth at bedtime as needed. for sleep 30 tablet 5   No current facility-administered medications for this visit.    Patient confirms/reports the following allergies:  Allergies  Allergen Reactions  . Oyster Extract Nausea And Vomiting    No orders of the defined types were placed in this encounter.   AUTHORIZATION INFORMATION Primary Insurance: 1D#: Group #:  Secondary Insurance: 1D#: Group #:  SCHEDULE INFORMATION: Date: Patient will call back to schedule at a later date. Time: Location: [facility]

## 2020-10-12 ENCOUNTER — Other Ambulatory Visit: Payer: Self-pay | Admitting: Family Medicine

## 2020-10-12 DIAGNOSIS — K529 Noninfective gastroenteritis and colitis, unspecified: Secondary | ICD-10-CM

## 2020-10-12 MED ORDER — ONDANSETRON HCL 8 MG PO TABS: 8.0000 mg | ORAL_TABLET | Freq: Three times a day (TID) | ORAL | 1 refills | Status: AC | PRN

## 2020-11-29 ENCOUNTER — Other Ambulatory Visit: Payer: Self-pay | Admitting: Family Medicine

## 2020-11-29 DIAGNOSIS — F419 Anxiety disorder, unspecified: Secondary | ICD-10-CM

## 2020-11-29 MED ORDER — ALPRAZOLAM 1 MG PO TABS: 1.0000 mg | ORAL_TABLET | Freq: Two times a day (BID) | ORAL | 5 refills | Status: DC | PRN

## 2021-01-23 ENCOUNTER — Other Ambulatory Visit: Payer: Self-pay | Admitting: Family Medicine

## 2021-01-23 MED ORDER — ZOLPIDEM TARTRATE ER 6.25 MG PO TBCR
6.2500 mg | EXTENDED_RELEASE_TABLET | Freq: Every evening | ORAL | 5 refills | Status: DC | PRN
Start: 2021-01-23 — End: 2021-07-27

## 2021-04-26 ENCOUNTER — Other Ambulatory Visit: Payer: Self-pay | Admitting: Family Medicine

## 2021-04-26 DIAGNOSIS — S39012A Strain of muscle, fascia and tendon of lower back, initial encounter: Secondary | ICD-10-CM

## 2021-04-26 MED ORDER — HYDROCODONE-ACETAMINOPHEN 7.5-325 MG PO TABS: 1.0000 | ORAL_TABLET | Freq: Four times a day (QID) | ORAL | 0 refills | Status: DC | PRN

## 2021-04-26 NOTE — Progress Notes (Signed)
Acute back strain

## 2021-07-16 ENCOUNTER — Other Ambulatory Visit: Payer: Self-pay | Admitting: Family Medicine

## 2021-07-16 DIAGNOSIS — F419 Anxiety disorder, unspecified: Secondary | ICD-10-CM

## 2021-07-16 MED ORDER — ALPRAZOLAM 1 MG PO TABS: 1.0000 mg | ORAL_TABLET | Freq: Two times a day (BID) | ORAL | 5 refills | Status: DC | PRN

## 2021-07-16 MED ORDER — HYDROCODONE-ACETAMINOPHEN 7.5-325 MG PO TABS: 1.0000 | ORAL_TABLET | Freq: Four times a day (QID) | ORAL | 0 refills | Status: DC | PRN

## 2021-07-27 ENCOUNTER — Other Ambulatory Visit: Payer: Self-pay | Admitting: Family Medicine

## 2021-07-27 MED ORDER — ZOLPIDEM TARTRATE ER 6.25 MG PO TBCR
6.2500 mg | EXTENDED_RELEASE_TABLET | Freq: Every evening | ORAL | 5 refills | Status: DC | PRN
Start: 2021-07-27 — End: 2022-01-09

## 2021-08-06 ENCOUNTER — Other Ambulatory Visit: Payer: Self-pay | Admitting: Family Medicine

## 2021-08-06 DIAGNOSIS — I10 Essential (primary) hypertension: Secondary | ICD-10-CM

## 2021-08-06 DIAGNOSIS — E1122 Type 2 diabetes mellitus with diabetic chronic kidney disease: Secondary | ICD-10-CM

## 2021-08-06 DIAGNOSIS — N182 Chronic kidney disease, stage 2 (mild): Secondary | ICD-10-CM

## 2021-08-06 NOTE — Telephone Encounter (Signed)
Requested medication (s) are due for refill today: Yes  Requested medication (s) are on the active medication list: Yes  Last refill:  08/08/20  Future visit scheduled: Yes 08/09/21  Notes to clinic:  Unable to refill per protocol due to failed labs, no updated results.      Requested Prescriptions  Pending Prescriptions Disp Refills   losartan (COZAAR) 100 MG tablet [Pharmacy Med Name: LOSARTAN POTASSIUM 100 MG TAB] 90 tablet 3    Sig: TAKE 1 TABLET BY MOUTH DAILY     Cardiovascular:  Angiotensin Receptor Blockers Failed - 08/06/2021 12:08 PM      Failed - Cr in normal range and within 180 days    Creat  Date Value Ref Range Status  06/19/2017 1.08 0.70 - 1.25 mg/dL Final    Comment:    For patients >43 years of age, the reference limit for Creatinine is approximately 13% higher for people identified as African-American. .    Creatinine, Ser  Date Value Ref Range Status  08/03/2020 0.96 0.76 - 1.27 mg/dL Final          Failed - K in normal range and within 180 days    Potassium  Date Value Ref Range Status  08/03/2020 4.0 3.5 - 5.2 mmol/L Final          Failed - Valid encounter within last 6 months    Recent Outpatient Visits           1 year ago Annual physical exam   [facility] Jerrol Banana., MD   2 years ago Annual physical exam   [facility] Jerrol Banana., MD   2 years ago Cough   [facility] Jerrol Banana., MD   4 years ago Physical exam, annual   [facility] Jerrol Banana., MD   5 years ago Type 2 diabetes mellitus with stage 2 chronic kidney disease, without long-term current use of insulin [facility])   [facility] Jerrol Banana., MD       Future Appointments             In 3 days Jerrol Banana., MD [facility] [place] - Patient is not pregnant      Passed - Last BP in normal range    BP  Readings from Last 1 Encounters:  08/03/20 127/68           rosuvastatin (CRESTOR) 10 MG tablet [Pharmacy Med Name: ROSUVASTATIN CALCIUM 10 MG TAB] 90 tablet 3    Sig: TAKE 1 TABLET BY MOUTH DAILY     Cardiovascular:  Antilipid - Statins Failed - 08/06/2021 12:08 PM      Failed - Total Cholesterol in normal range and within 360 days    Cholesterol, Total  Date Value Ref Range Status  08/03/2020 135 100 - 199 mg/dL Final          Failed - LDL in normal range and within 360 days    LDL Cholesterol (Calc)  Date Value Ref Range Status  06/19/2017 72 mg/dL (calc) Final    Comment:    Reference range: <100 . Desirable range <100 mg/dL for primary prevention;   <70 mg/dL for patients with CHD or diabetic patients  with > or = 2 CHD risk factors. Marland Kitchen LDL-C is now calculated using the Martin-Hopkins  calculation, which is a validated  novel method providing  better accuracy than the Friedewald equation in the  estimation of LDL-C.  Cresenciano Genre et al. Annamaria Helling. 7989;211(94): 2061-2068  (http://education.[employer].com/faq/FAQ164)    LDL Chol Calc (NIH)  Date Value Ref Range Status  08/03/2020 80 0 - 99 mg/dL Final          Failed - HDL in normal range and within 360 days    HDL  Date Value Ref Range Status  08/03/2020 38 (L) >39 mg/dL Final          Failed - Triglycerides in normal range and within 360 days    Triglycerides  Date Value Ref Range Status  08/03/2020 85 0 - 149 mg/dL Final          Failed - Valid encounter within last 12 months    Recent Outpatient Visits           1 year ago Annual physical exam   [facility] Jerrol Banana., MD   2 years ago Annual physical exam   [facility] Jerrol Banana., MD   2 years ago Cough   [facility] Jerrol Banana., MD   4 years ago Physical exam, annual   [facility] Jerrol Banana., MD   5 years ago Type 2 diabetes mellitus with  stage 2 chronic kidney disease, without long-term current use of insulin [facility])   [facility] Jerrol Banana., MD       Future Appointments             In 3 days Jerrol Banana., MD [facility] [place] - Patient is not pregnant       metFORMIN (GLUCOPHAGE) 500 MG tablet [Pharmacy Med Name: METFORMIN HCL 500 MG TAB] 180 tablet 3    Sig: TAKE ONE (1) TABLET BY MOUTH TWO TIMES PER DAY     Endocrinology:  Diabetes - Biguanides Failed - 08/06/2021 12:08 PM      Failed - Cr in normal range and within 360 days    Creat  Date Value Ref Range Status  06/19/2017 1.08 0.70 - 1.25 mg/dL Final    Comment:    For patients >62 years of age, the reference limit for Creatinine is approximately 13% higher for people identified as African-American. .    Creatinine, Ser  Date Value Ref Range Status  08/03/2020 0.96 0.76 - 1.27 mg/dL Final          Failed - HBA1C is between 0 and 7.9 and within 180 days    Hgb A1c MFr Bld  Date Value Ref Range Status  08/03/2020 4.8 4.8 - 5.6 % Final    Comment:             Prediabetes: 5.7 - 6.4          Diabetes: >6.4          Glycemic control for adults with diabetes: <7.0           Failed - eGFR in normal range and within 360 days    GFR, Est African American  Date Value Ref Range Status  06/19/2017 86 > OR = 60 mL/min/1.78m2 Final   GFR calc Af Amer  Date Value Ref Range Status  08/03/2020 97 >59 mL/min/1.73 Final    Comment:    **In accordance with recommendations from the NKF-ASN Task force,**  [facility] is in the process of updating its eGFR calculation to the   2021 CKD-EPI creatinine equation that estimates kidney function   without a race variable.    GFR, Est Non African American  Date Value Ref Range Status  06/19/2017 74 > OR = 60 mL/min/1.50m2 Final   GFR calc non Af Amer  Date Value Ref Range Status  08/03/2020 84 >59 mL/min/1.73 Final          Failed -  Valid encounter within last 6 months    Recent Outpatient Visits           1 year ago Annual physical exam   [facility] Jerrol Banana., MD   2 years ago Annual physical exam   [facility] Jerrol Banana., MD   2 years ago Cough   [facility] Jerrol Banana., MD   4 years ago Physical exam, annual   [facility] Jerrol Banana., MD   5 years ago Type 2 diabetes mellitus with stage 2 chronic kidney disease, without long-term current use of insulin [facility])   [facility] Jerrol Banana., MD       Future Appointments             In 3 days Jerrol Banana., MD [facility], [place]

## 2021-08-09 ENCOUNTER — Other Ambulatory Visit: Payer: Self-pay

## 2021-08-09 ENCOUNTER — Encounter: Payer: Self-pay | Admitting: Family Medicine

## 2021-08-09 ENCOUNTER — Ambulatory Visit (INDEPENDENT_AMBULATORY_CARE_PROVIDER_SITE_OTHER): Payer: BC Managed Care – PPO | Admitting: Family Medicine

## 2021-08-09 VITALS — BP 151/58 | HR 63 | Temp 98.6°F | Ht 70.0 in | Wt 193.0 lb

## 2021-08-09 DIAGNOSIS — N182 Chronic kidney disease, stage 2 (mild): Secondary | ICD-10-CM | POA: Diagnosis not present

## 2021-08-09 DIAGNOSIS — M5030 Other cervical disc degeneration, unspecified cervical region: Secondary | ICD-10-CM

## 2021-08-09 DIAGNOSIS — Z0000 Encounter for general adult medical examination without abnormal findings: Secondary | ICD-10-CM | POA: Diagnosis not present

## 2021-08-09 DIAGNOSIS — E119 Type 2 diabetes mellitus without complications: Secondary | ICD-10-CM | POA: Diagnosis not present

## 2021-08-09 DIAGNOSIS — E1122 Type 2 diabetes mellitus with diabetic chronic kidney disease: Secondary | ICD-10-CM | POA: Diagnosis not present

## 2021-08-09 DIAGNOSIS — I10 Essential (primary) hypertension: Secondary | ICD-10-CM | POA: Diagnosis not present

## 2021-08-09 DIAGNOSIS — E7800 Pure hypercholesterolemia, unspecified: Secondary | ICD-10-CM

## 2021-08-09 DIAGNOSIS — Z125 Encounter for screening for malignant neoplasm of prostate: Secondary | ICD-10-CM | POA: Diagnosis not present

## 2021-08-09 DIAGNOSIS — J301 Allergic rhinitis due to pollen: Secondary | ICD-10-CM

## 2021-08-09 LAB — HM DIABETES EYE EXAM

## 2021-08-09 NOTE — Progress Notes (Signed)
Complete physical exam   Patient: Willie Billet, MD   DOB: 1955-12-13   65 y.o. Male  MRN: [id number] Visit Date: 08/09/2021  Today's healthcare provider: Wilhemena Durie, MD   No chief complaint on file.  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  He reports consuming a general diet.  Exercises regularly.  He generally feels well. He reports sleeping well. He does not have additional problems to discuss today.  Patient is married internal medicine physician.  He is a father of 3 all of whom are now married.  He has 1 grandchild now. Did have a normal diabetic eye exam earlier today. His home A1c was 4.7 today.  He has no hypoglycemia.  Overall he feels well. HPI    No past medical history on file. Past Surgical History:  Procedure Laterality Date   CHOLECYSTECTOMY     DENTAL SURGERY     Social History   Socioeconomic History   Marital status: Married    Spouse name: Not on file   Number of children: Not on file   Years of education: Not on file   Highest education level: Not on file  Occupational History   Not on file  Tobacco Use   Smoking status: Never   Smokeless tobacco: Never  Vaping Use   Vaping Use: Never used  Substance and Sexual Activity   Alcohol use: Yes    Alcohol/week: 1.0 standard drink    Types: 1 Cans of beer per week   Drug use: No   Sexual activity: Not on file  Other Topics Concern   Not on file  Social History Narrative   Not on file   Social Determinants of Health   Financial Resource Strain: Not on file  Food Insecurity: Not on file  Transportation Needs: Not on file  Physical Activity: Not on file  Stress: Not on file  Social Connections: Not on file  Intimate Partner Violence: Not on file   Family Status  Relation Name Status   Mother  Deceased   Father  Deceased   Sister  Alive   Brother  Deceased   MGF  Alive   PGM  (Not Specified)   PGF  (Not Specified)    Neg Hx  (Not Specified)   Family History  Problem Relation Age of Onset   Arthritis Mother    Hyperlipidemia Mother    Liver cancer Mother    Endometrial cancer Mother    Arthritis Father    Prostate cancer Father    Stroke Father    Hyperlipidemia Sister    Heart disease Paternal Grandmother    Stroke Paternal Grandfather    Colon cancer Neg Hx    Allergies  Allergen Reactions   Oyster Extract Nausea And Vomiting    Patient Care Team: Jerrol Banana., MD as PCP - General (Family Medicine)   Medications: Outpatient Medications Prior to Visit  Medication Sig   ALPRAZolam (XANAX) 1 MG tablet Take 1 tablet (1 mg total) by mouth 2 (two) times daily as needed for anxiety.   aspirin 81 MG tablet Take by mouth. Reported on 10/26/2015   cholecalciferol (VITAMIN D3) 25 MCG (1000 UT) tablet Take 1,000 Units by mouth daily. 5 days a week   empagliflozin (JARDIANCE) 25 MG TABS tablet Take 25 mg by mouth daily.   fluticasone (FLONASE) 50 MCG/ACT nasal spray Place 2  sprays into both nostrils daily.   HYDROcodone-acetaminophen (NORCO) 7.5-325 MG tablet Take 1 tablet by mouth every 6 (six) hours as needed for moderate pain.   losartan (COZAAR) 100 MG tablet TAKE 1 TABLET BY MOUTH DAILY   meloxicam (MOBIC) 15 MG tablet Take 1 tablet (15 mg total) by mouth daily as needed for pain.   metFORMIN (GLUCOPHAGE) 500 MG tablet TAKE ONE (1) TABLET BY MOUTH TWO TIMES PER DAY   Multiple Vitamins-Minerals (PRESERVISION AREDS) CAPS Take by mouth.   ondansetron (ZOFRAN) 8 MG tablet Take 1 tablet (8 mg total) by mouth every 8 (eight) hours as needed for nausea or vomiting.   rosuvastatin (CRESTOR) 10 MG tablet TAKE 1 TABLET BY MOUTH DAILY   zolpidem (AMBIEN CR) 6.25 MG CR tablet Take 1 tablet (6.25 mg total) by mouth at bedtime as needed. for sleep   No facility-administered medications prior to visit.    Review of Systems  Constitutional: Negative.   HENT: Negative.    Eyes: Negative.    Respiratory: Negative.    Cardiovascular: Negative.   Gastrointestinal: Negative.   Endocrine: Negative.   Genitourinary: Negative.   Musculoskeletal:  Positive for arthralgias and back pain. Negative for gait problem, joint swelling, myalgias, neck pain and neck stiffness.  Skin: Negative.   Allergic/Immunologic: Negative.   Hematological: Negative.   Psychiatric/Behavioral: Negative.    All other systems reviewed and are negative.    Objective    BP (!) 151/58 (BP Location: Left Arm, Patient Position: Sitting, Cuff Size: Large)   Pulse 63   Temp 98.6 F (37 C) (Oral)   Ht 5\' 10"  (1.778 m)   Wt 193 lb (87.5 kg)   SpO2 99%   BMI 27.69 kg/m    Physical Exam Vitals reviewed.  Constitutional:      Appearance: Normal appearance.  HENT:     Head: Normocephalic and atraumatic.     Right Ear: External ear normal.     Left Ear: External ear normal.     Mouth/Throat:     Mouth: Mucous membranes are moist.     Pharynx: Oropharynx is clear.  Eyes:     General: No scleral icterus.    Conjunctiva/sclera: Conjunctivae normal.  Neck:     Vascular: No carotid bruit.  Cardiovascular:     Rate and Rhythm: Normal rate and regular rhythm.     Pulses: Normal pulses.     Heart sounds: Normal heart sounds.  Pulmonary:     Effort: Pulmonary effort is normal.     Breath sounds: Normal breath sounds.  Abdominal:     Palpations: Abdomen is soft.  Musculoskeletal:     Right lower leg: No edema.     Left lower leg: No edema.     Comments: He is tender on the plantar foot under the area of the second metatarsal head.  It is mild tenderness.  No crepitance, no swelling, no erythema.  Lymphadenopathy:     Cervical: No cervical adenopathy.  Skin:    General: Skin is warm and dry.     Comments: There appears to be a corn on the left second toe.  No secondary infection. Several small lipomas on the trunk and upper extremities  Neurological:     General: No focal deficit present.      Mental Status: He is alert and oriented to person, place, and time.     Comments: Normal monofilament exam of the feet.  Psychiatric:  Mood and Affect: Mood normal.        Behavior: Behavior normal.        Thought Content: Thought content normal.        Judgment: Judgment normal.      Last depression screening scores PHQ 2/9 Scores 08/09/2021 08/03/2020 07/29/2019  PHQ - 2 Score 0 0 0  PHQ- 9 Score 0 0 0   Last fall risk screening Fall Risk  08/09/2021  Falls in the past year? 0  Number falls in past yr: 0  Injury with Fall? 0  Risk for fall due to : No Fall Risks  Follow up Falls evaluation completed   Last Audit-C alcohol use screening Alcohol Use Disorder Test (AUDIT) 08/09/2021  1. How often do you have a drink containing alcohol? 2  2. How many drinks containing alcohol do you have on a typical day when you are drinking? 0  3. How often do you have six or more drinks on one occasion? 0  AUDIT-C Score 2  4. How often during the last year have you found that you were not able to stop drinking once you had started? -  5. How often during the last year have you failed to do what was normally expected from you because of drinking? -  6. How often during the last year have you needed a first drink in the morning to get yourself going after a heavy drinking session? -  7. How often during the last year have you had a feeling of guilt of remorse after drinking? -  8. How often during the last year have you been unable to remember what happened the night before because you had been drinking? -  9. Have you or someone else been injured as a result of your drinking? -  10. Has a relative or friend or a doctor or another health worker been concerned about your drinking or suggested you cut down? -  Alcohol Use Disorder Identification Test Final Score (AUDIT) -  Alcohol Brief Interventions/Follow-up -   A score of 3 or more in women, and 4 or more in men indicates increased risk  for alcohol abuse, EXCEPT if all of the points are from question 1   No results found for any visits on 08/09/21.  Assessment & Plan    Routine Health Maintenance and Physical Exam  Exercise Activities and Dietary recommendations  Goals   None     Immunization History  Administered Date(s) Administered   Hepatitis B 05/30/2011, 06/27/2011, 11/28/2011   Hepatitis B, adult 05/30/2011, 06/27/2011, 11/28/2011   Influenza,inj,Quad PF,6+ Mos 07/01/2015   Influenza,inj,quad, With Preservative 07/21/2019   Influenza-Unspecified 07/14/2020, 07/13/2021   Moderna Sars-Covid-2 Vaccination 06/23/2020   PFIZER(Purple Top)SARS-COV-2 Vaccination 10/07/2019, 10/28/2019   Tdap 05/16/2011   Zoster Recombinat (Shingrix) 10/16/2018, 02/12/2019    Health Maintenance  Topic Date Due   Pneumococcal Vaccine 57-71 Years old (1 - PCV) Never done   FOOT EXAM  Never done   HIV Screening  Never done   Hepatitis C Screening  Never done   COLONOSCOPY (Pts 45-86yrs Insurance coverage will need to be confirmed)  Never done   COVID-19 Vaccine (4 - Booster for Pfizer series) 08/18/2020   OPHTHALMOLOGY EXAM  11/01/2020   HEMOGLOBIN A1C  01/31/2021   TETANUS/TDAP  05/15/2021   INFLUENZA VACCINE  Completed   Zoster Vaccines- Shingrix  Completed   HPV VACCINES  Aged Out    Discussed health benefits of physical activity, and encouraged  him to engage in regular exercise appropriate for his age and condition.  1. Annual physical exam Patient will follow-up with GI about screening colonoscopy. - Lipid panel - TSH - CBC w/Diff/Platelet - Comprehensive Metabolic Panel (CMET) - Hemoglobin A1c  2. Essential hypertension Blood pressure at home reads 120s to 130s over 60s - Lipid panel - TSH - CBC w/Diff/Platelet - Comprehensive Metabolic Panel (CMET) - Hemoglobin A1c  3. Type 2 diabetes mellitus with stage 2 chronic kidney disease, without long-term current use of insulin ([place]) Sitter stopping either  [place] or [place].  Having no hypoglycemia so it is fine to continue those. - Lipid panel - TSH - CBC w/Diff/Platelet - Comprehensive Metabolic Panel (CMET) - Hemoglobin A1c  4. Pure hypercholesterolemia On rosuvastatin 10 - Lipid panel - TSH - CBC w/Diff/Platelet - Comprehensive Metabolic Panel (CMET) - Hemoglobin A1c  5. Seasonal allergic rhinitis due to pollen  - Lipid panel - TSH - CBC w/Diff/Platelet - Comprehensive Metabolic Panel (CMET) - Hemoglobin A1c  6. Screening for prostate cancer  - PSA - Hemoglobin A1c  7. DDD (degenerative disc disease), cervical Chronic and stable problem   No follow-ups on file.     I, Wilhemena Durie, MD, have reviewed all documentation for this visit. The documentation on 08/12/21 for the exam, diagnosis, procedures, and orders are all accurate and complete.    Raidon Swanner Cranford Mon, MD  [facility] [phone] (phone) [phone] (fax)  [place]

## 2021-08-10 LAB — CBC WITH DIFFERENTIAL/PLATELET
Basophils Absolute: 0 10*3/uL (ref 0.0–0.2)
Basos: 0 %
EOS (ABSOLUTE): 0.1 10*3/uL (ref 0.0–0.4)
Eos: 3 %
Hematocrit: 42.8 % (ref 37.5–51.0)
Hemoglobin: 14.8 g/dL (ref 13.0–17.7)
Immature Grans (Abs): 0 10*3/uL (ref 0.0–0.1)
Immature Granulocytes: 0 %
Lymphocytes Absolute: 0.9 10*3/uL (ref 0.7–3.1)
Lymphs: 18 %
MCH: 34.7 pg — ABNORMAL HIGH (ref 26.6–33.0)
MCHC: 34.6 g/dL (ref 31.5–35.7)
MCV: 100 fL — ABNORMAL HIGH (ref 79–97)
Monocytes Absolute: 0.4 10*3/uL (ref 0.1–0.9)
Monocytes: 7 %
Neutrophils Absolute: 3.7 10*3/uL (ref 1.4–7.0)
Neutrophils: 72 %
Platelets: 121 10*3/uL — ABNORMAL LOW (ref 150–450)
RBC: 4.27 x10E6/uL (ref 4.14–5.80)
RDW: 13.2 % (ref 11.6–15.4)
WBC: 5.2 10*3/uL (ref 3.4–10.8)

## 2021-08-10 LAB — COMPREHENSIVE METABOLIC PANEL
ALT: 60 IU/L — ABNORMAL HIGH (ref 0–44)
AST: 32 IU/L (ref 0–40)
Albumin/Globulin Ratio: 2.6 — ABNORMAL HIGH (ref 1.2–2.2)
Albumin: 5.2 g/dL — ABNORMAL HIGH (ref 3.8–4.8)
Alkaline Phosphatase: 66 IU/L (ref 44–121)
BUN/Creatinine Ratio: 13 (ref 10–24)
BUN: 15 mg/dL (ref 8–27)
Bilirubin Total: 1.8 mg/dL — ABNORMAL HIGH (ref 0.0–1.2)
CO2: 24 mmol/L (ref 20–29)
Calcium: 9.6 mg/dL (ref 8.6–10.2)
Chloride: 99 mmol/L (ref 96–106)
Creatinine, Ser: 1.15 mg/dL (ref 0.76–1.27)
Globulin, Total: 2 g/dL (ref 1.5–4.5)
Glucose: 86 mg/dL (ref 70–99)
Potassium: 4.5 mmol/L (ref 3.5–5.2)
Sodium: 139 mmol/L (ref 134–144)
Total Protein: 7.2 g/dL (ref 6.0–8.5)
eGFR: 71 mL/min/{1.73_m2} (ref >59)

## 2021-08-10 LAB — LIPID PANEL
Chol/HDL Ratio: 3.3 ratio (ref 0.0–5.0)
Cholesterol, Total: 109 mg/dL (ref 100–199)
HDL: 33 mg/dL — ABNORMAL LOW (ref >39)
LDL Chol Calc (NIH): 54 mg/dL (ref 0–99)
Triglycerides: 123 mg/dL (ref 0–149)
VLDL Cholesterol Cal: 22 mg/dL (ref 5–40)

## 2021-08-10 LAB — HEMOGLOBIN A1C
Est. average glucose Bld gHb Est-mCnc: 88 mg/dL
Hgb A1c MFr Bld: 4.7 % — ABNORMAL LOW (ref 4.8–5.6)

## 2021-08-10 LAB — PSA: Prostate Specific Ag, Serum: 4.6 ng/mL — ABNORMAL HIGH (ref 0.0–4.0)

## 2021-08-10 LAB — TSH: TSH: 0.971 u[IU]/mL (ref 0.450–4.500)

## 2021-09-27 ENCOUNTER — Other Ambulatory Visit: Payer: Self-pay | Admitting: *Deleted

## 2021-09-27 DIAGNOSIS — M542 Cervicalgia: Secondary | ICD-10-CM

## 2021-09-28 MED ORDER — HYDROCODONE-ACETAMINOPHEN 7.5-325 MG PO TABS: 1.0000 | ORAL_TABLET | ORAL | 0 refills | Status: DC | PRN

## 2022-01-09 ENCOUNTER — Other Ambulatory Visit: Payer: Self-pay | Admitting: Family Medicine

## 2022-01-09 DIAGNOSIS — M542 Cervicalgia: Secondary | ICD-10-CM

## 2022-01-09 MED ORDER — HYDROCODONE-ACETAMINOPHEN 7.5-325 MG PO TABS: 1.0000 | ORAL_TABLET | ORAL | 0 refills | Status: DC | PRN

## 2022-01-09 NOTE — Progress Notes (Signed)
Back strain

## 2022-01-25 ENCOUNTER — Other Ambulatory Visit: Payer: Self-pay | Admitting: Family Medicine

## 2022-01-25 DIAGNOSIS — M542 Cervicalgia: Secondary | ICD-10-CM

## 2022-01-25 MED ORDER — HYDROCODONE-ACETAMINOPHEN 7.5-325 MG PO TABS: 1.0000 | ORAL_TABLET | ORAL | 0 refills | Status: DC | PRN

## 2022-01-25 NOTE — Progress Notes (Signed)
Recurrent LS strain with DDD.  ?

## 2022-02-20 ENCOUNTER — Other Ambulatory Visit: Payer: Self-pay | Admitting: Family Medicine

## 2022-02-20 DIAGNOSIS — M542 Cervicalgia: Secondary | ICD-10-CM

## 2022-02-20 DIAGNOSIS — R972 Elevated prostate specific antigen [PSA]: Secondary | ICD-10-CM

## 2022-02-20 MED ORDER — HYDROCODONE-ACETAMINOPHEN 7.5-325 MG PO TABS: 1.0000 | ORAL_TABLET | ORAL | 0 refills | Status: DC | PRN

## 2022-02-20 NOTE — Progress Notes (Signed)
Recurrent low back strain from recent yard work. Norco sent in. PSA ordered. If up will need urology referral.

## 2022-02-21 DIAGNOSIS — R972 Elevated prostate specific antigen [PSA]: Secondary | ICD-10-CM | POA: Diagnosis not present

## 2022-02-22 LAB — PSA TOTAL (REFLEX TO FREE): Prostate Specific Ag, Serum: 3.7 ng/mL (ref 0.0–4.0)

## 2022-03-05 ENCOUNTER — Other Ambulatory Visit: Payer: Self-pay

## 2022-03-15 ENCOUNTER — Other Ambulatory Visit: Payer: Self-pay | Admitting: Family Medicine

## 2022-03-15 DIAGNOSIS — M542 Cervicalgia: Secondary | ICD-10-CM

## 2022-03-15 MED ORDER — ZOLPIDEM TARTRATE ER 6.25 MG PO TBCR: 6.2500 mg | EXTENDED_RELEASE_TABLET | Freq: Every evening | ORAL | 5 refills | Status: AC | PRN

## 2022-03-15 MED ORDER — HYDROCODONE-ACETAMINOPHEN 7.5-325 MG PO TABS: 1.0000 | ORAL_TABLET | ORAL | 0 refills | Status: DC | PRN

## 2022-03-20 ENCOUNTER — Other Ambulatory Visit: Payer: Self-pay | Admitting: Family Medicine

## 2022-03-20 DIAGNOSIS — F419 Anxiety disorder, unspecified: Secondary | ICD-10-CM

## 2022-03-20 MED ORDER — ALPRAZOLAM 1 MG PO TABS: 1.0000 mg | ORAL_TABLET | Freq: Two times a day (BID) | ORAL | 5 refills | Status: AC | PRN

## 2022-03-28 ENCOUNTER — Other Ambulatory Visit: Payer: Self-pay | Admitting: Family Medicine

## 2022-03-28 DIAGNOSIS — M542 Cervicalgia: Secondary | ICD-10-CM

## 2022-03-28 MED ORDER — HYDROCODONE-ACETAMINOPHEN 7.5-325 MG PO TABS: 1.0000 | ORAL_TABLET | ORAL | 0 refills | Status: DC | PRN

## 2022-04-15 ENCOUNTER — Other Ambulatory Visit: Payer: Self-pay | Admitting: Family Medicine

## 2022-04-15 DIAGNOSIS — M542 Cervicalgia: Secondary | ICD-10-CM

## 2022-04-15 MED ORDER — HYDROCODONE-ACETAMINOPHEN 7.5-325 MG PO TABS: 1.0000 | ORAL_TABLET | ORAL | 0 refills | Status: DC | PRN

## 2022-05-02 ENCOUNTER — Other Ambulatory Visit: Payer: Self-pay | Admitting: Family Medicine

## 2022-05-02 DIAGNOSIS — M542 Cervicalgia: Secondary | ICD-10-CM

## 2022-05-02 MED ORDER — HYDROCODONE-ACETAMINOPHEN 7.5-325 MG PO TABS: 1.0000 | ORAL_TABLET | ORAL | 0 refills | Status: DC | PRN

## 2022-05-02 NOTE — Progress Notes (Signed)
Recurrent back /neck pain/DDD.

## 2022-05-07 ENCOUNTER — Other Ambulatory Visit: Payer: Self-pay | Admitting: Family Medicine

## 2022-05-07 DIAGNOSIS — I10 Essential (primary) hypertension: Secondary | ICD-10-CM

## 2022-05-16 ENCOUNTER — Other Ambulatory Visit: Payer: Self-pay | Admitting: Family Medicine

## 2022-05-16 DIAGNOSIS — M542 Cervicalgia: Secondary | ICD-10-CM

## 2022-05-16 MED ORDER — HYDROCODONE-ACETAMINOPHEN 7.5-325 MG PO TABS: 1.0000 | ORAL_TABLET | ORAL | 0 refills | Status: DC | PRN

## 2022-05-16 NOTE — Progress Notes (Signed)
Back/neck pain/DDD

## 2022-05-29 ENCOUNTER — Other Ambulatory Visit: Payer: Self-pay | Admitting: Family Medicine

## 2022-05-29 DIAGNOSIS — M542 Cervicalgia: Secondary | ICD-10-CM

## 2022-05-29 MED ORDER — HYDROCODONE-ACETAMINOPHEN 7.5-325 MG PO TABS: 1.0000 | ORAL_TABLET | ORAL | 0 refills | Status: DC | PRN

## 2022-06-13 ENCOUNTER — Other Ambulatory Visit: Payer: Self-pay | Admitting: Family Medicine

## 2022-06-13 DIAGNOSIS — E1122 Type 2 diabetes mellitus with diabetic chronic kidney disease: Secondary | ICD-10-CM

## 2022-06-13 DIAGNOSIS — I10 Essential (primary) hypertension: Secondary | ICD-10-CM

## 2022-06-13 DIAGNOSIS — M542 Cervicalgia: Secondary | ICD-10-CM

## 2022-06-13 MED ORDER — HYDROCODONE-ACETAMINOPHEN 10-325 MG PO TABS: 1.0000 | ORAL_TABLET | Freq: Four times a day (QID) | ORAL | 0 refills | Status: DC | PRN

## 2022-06-13 MED ORDER — LOSARTAN POTASSIUM 100 MG PO TABS: 100.0000 mg | ORAL_TABLET | Freq: Every day | ORAL | 3 refills | Status: AC

## 2022-06-13 MED ORDER — METFORMIN HCL 500 MG PO TABS: ORAL_TABLET | ORAL | 3 refills | Status: AC

## 2022-06-13 MED ORDER — ROSUVASTATIN CALCIUM 10 MG PO TABS: 10.0000 mg | ORAL_TABLET | Freq: Every day | ORAL | 3 refills | Status: AC

## 2022-06-24 ENCOUNTER — Other Ambulatory Visit: Payer: Self-pay | Admitting: Family Medicine

## 2022-06-24 MED ORDER — HYDROCODONE-ACETAMINOPHEN 10-325 MG PO TABS: 1.0000 | ORAL_TABLET | Freq: Four times a day (QID) | ORAL | 0 refills | Status: AC | PRN

## 2022-06-24 MED ORDER — AZITHROMYCIN 250 MG PO TABS: ORAL_TABLET | ORAL | 0 refills | Status: AC

## 2022-06-24 MED ORDER — MELOXICAM 15 MG PO TABS: 15.0000 mg | ORAL_TABLET | Freq: Every day | ORAL | 11 refills | Status: AC | PRN

## 2022-06-24 NOTE — Progress Notes (Signed)
Back pain/neck pain

## 2022-12-13 ENCOUNTER — Other Ambulatory Visit: Payer: Self-pay

## 2023-10-30 DIAGNOSIS — Z125 Encounter for screening for malignant neoplasm of prostate: Secondary | ICD-10-CM | POA: Diagnosis not present

## 2023-10-30 DIAGNOSIS — I10 Essential (primary) hypertension: Secondary | ICD-10-CM | POA: Diagnosis not present

## 2023-10-30 DIAGNOSIS — E119 Type 2 diabetes mellitus without complications: Secondary | ICD-10-CM | POA: Diagnosis not present

## 2023-10-30 DIAGNOSIS — F419 Anxiety disorder, unspecified: Secondary | ICD-10-CM | POA: Diagnosis not present

## 2023-10-30 DIAGNOSIS — Z0000 Encounter for general adult medical examination without abnormal findings: Secondary | ICD-10-CM | POA: Diagnosis not present

## 2023-10-30 DIAGNOSIS — E7800 Pure hypercholesterolemia, unspecified: Secondary | ICD-10-CM | POA: Diagnosis not present
